# Patient Record
Sex: Female | Born: 1995 | Race: White | Hispanic: No | Marital: Single | Smoking: Never smoker
Health system: Southern US, Community
[De-identification: ages and names within clinical notes are randomized; demographics above are authoritative.]

## PROBLEM LIST (undated history)

## (undated) ENCOUNTER — Inpatient Hospital Stay: Payer: Self-pay

## (undated) DIAGNOSIS — D649 Anemia, unspecified: Secondary | ICD-10-CM

## (undated) DIAGNOSIS — N883 Incompetence of cervix uteri: Secondary | ICD-10-CM

## (undated) DIAGNOSIS — N83209 Unspecified ovarian cyst, unspecified side: Secondary | ICD-10-CM

## (undated) DIAGNOSIS — Z6711 Type A blood, Rh negative: Secondary | ICD-10-CM

## (undated) HISTORY — PX: NO PAST SURGERIES: SHX2092

## (undated) HISTORY — DX: Type A blood, Rh negative: Z67.11

## (undated) HISTORY — DX: Incompetence of cervix uteri: N88.3

---
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
## 2017-01-02 LAB — AFP TETRA
DIA MOM VALUE: 1.75
DIA VALUE (EIA): 317.85 pg/mL
DSR (BY AGE) 1 IN: 1170
DSR (Second Trimester) 1 IN: 1873
Gestational Age: 20 WEEKS
MATERNAL AGE AT EDD: 19.5 a
MSAFP Mom: 0.97
MSAFP: 50.3 ng/mL
MSHCG MOM: 1.98
MSHCG: 43312 m[IU]/mL
OSB RISK: 10000
T18 (By Age): 1:4558 {titer}
TEST RESULTS AFP: NEGATIVE
Weight: 169 [lb_av]
uE3 Mom: 1.97
uE3 Value: 3.42 ng/mL

## 2017-01-03 ENCOUNTER — Ambulatory Visit (INDEPENDENT_AMBULATORY_CARE_PROVIDER_SITE_OTHER): Payer: 59

## 2017-01-03 DIAGNOSIS — Z8751 Personal history of pre-term labor: Secondary | ICD-10-CM | POA: Diagnosis not present

## 2017-01-03 MED ORDER — HYDROXYPROGESTERONE CAPROATE 250 MG/ML IM OIL
250.0000 mg | TOPICAL_OIL | Freq: Once | INTRAMUSCULAR | Status: AC
  Administered 2017-01-03: 250 mg via INTRAMUSCULAR

## 2017-01-03 NOTE — Progress Notes (Signed)
Pt here for Makena inj which was given IM right glut. NDC# [id number].  Pt changed back to IM inj instead of subq d/t site itching.

## 2017-01-06 ENCOUNTER — Other Ambulatory Visit: Payer: Self-pay | Admitting: Obstetrics and Gynecology

## 2017-01-06 MED ORDER — HYDROXYPROGESTERONE CAPROATE 250 MG/ML IM OIL: 250.0000 mg | TOPICAL_OIL | Freq: Once | INTRAMUSCULAR | 4 refills | Status: DC

## 2017-01-07 ENCOUNTER — Telehealth: Payer: Self-pay | Admitting: Obstetrics & Gynecology

## 2017-01-07 ENCOUNTER — Emergency Department
Admission: EM | Admit: 2017-01-07 | Discharge: 2017-01-07 | Disposition: A | Discharge status: Home / Self Care | Payer: 59 | Attending: Emergency Medicine | Admitting: Emergency Medicine

## 2017-01-07 ENCOUNTER — Encounter: Payer: Self-pay | Admitting: Emergency Medicine

## 2017-01-07 DIAGNOSIS — G90A Postural orthostatic tachycardia syndrome (POTS): Secondary | ICD-10-CM

## 2017-01-07 DIAGNOSIS — R000 Tachycardia, unspecified: Secondary | ICD-10-CM

## 2017-01-07 DIAGNOSIS — O26812 Pregnancy related exhaustion and fatigue, second trimester: Secondary | ICD-10-CM | POA: Diagnosis present

## 2017-01-07 DIAGNOSIS — I951 Orthostatic hypotension: Secondary | ICD-10-CM

## 2017-01-07 DIAGNOSIS — Z3A22 22 weeks gestation of pregnancy: Secondary | ICD-10-CM | POA: Insufficient documentation

## 2017-01-07 DIAGNOSIS — I498 Other specified cardiac arrhythmias: Secondary | ICD-10-CM

## 2017-01-07 DIAGNOSIS — R42 Dizziness and giddiness: Secondary | ICD-10-CM

## 2017-01-07 DIAGNOSIS — O99412 Diseases of the circulatory system complicating pregnancy, second trimester: Secondary | ICD-10-CM | POA: Insufficient documentation

## 2017-01-07 LAB — URINALYSIS, COMPLETE (UACMP) WITH MICROSCOPIC
Bacteria, UA: NONE SEEN
Bilirubin Urine: NEGATIVE
GLUCOSE, UA: NEGATIVE mg/dL
Hgb urine dipstick: NEGATIVE
Ketones, ur: NEGATIVE mg/dL
Nitrite: NEGATIVE
Protein, ur: NEGATIVE mg/dL
SPECIFIC GRAVITY, URINE: 1.028 (ref 1.005–1.030)
pH: 5 (ref 5.0–8.0)

## 2017-01-07 LAB — BASIC METABOLIC PANEL
Anion gap: 6 (ref 5–15)
BUN: 11 mg/dL (ref 6–20)
CALCIUM: 8.9 mg/dL (ref 8.9–10.3)
CO2: 23 mmol/L (ref 22–32)
CREATININE: 0.48 mg/dL (ref 0.44–1.00)
Chloride: 107 mmol/L (ref 101–111)
GFR calc non Af Amer: >60 mL/min (ref >60)
Glucose, Bld: 82 mg/dL (ref 65–99)
Potassium: 4 mmol/L (ref 3.5–5.1)
Sodium: 136 mmol/L (ref 135–145)

## 2017-01-07 LAB — CBC
HCT: 31.9 % — ABNORMAL LOW (ref 35.0–47.0)
Hemoglobin: 10.9 g/dL — ABNORMAL LOW (ref 12.0–16.0)
MCH: 28.5 pg (ref 26.0–34.0)
MCHC: 34.3 g/dL (ref 32.0–36.0)
MCV: 83.2 fL (ref 80.0–100.0)
PLATELETS: 188 10*3/uL (ref 150–440)
RBC: 3.84 MIL/uL (ref 3.80–5.20)
RDW: 13.5 % (ref 11.5–14.5)
WBC: 8.3 10*3/uL (ref 3.6–11.0)

## 2017-01-07 MED ORDER — SODIUM CHLORIDE 0.9 % IV SOLN
Freq: Once | INTRAVENOUS | Status: AC
Start: 2017-01-07 — End: 2017-01-07
  Administered 2017-01-07: 15:00:00 via INTRAVENOUS

## 2017-01-07 NOTE — Discharge Instructions (Signed)
Please drink plenty of fluids. Call Linda Choi side schedule an appointment if you do not have one in the next week. Rest and take it easy for the next few days. Please return if you continued to get worse

## 2017-01-07 NOTE — ED Triage Notes (Addendum)
Pt c/o dizziness for last 2 days. Is G3P1A1. Miscarried with first pregnancy.  Describes as lightheaded and room spinning. No pain. Ambulatory to first nurse desk. Pt [redacted] weeks pregnant

## 2017-01-07 NOTE — Telephone Encounter (Signed)
Pt is calling today needing an appointment and report she is dizzy and light headed. Pt states she is [redacted] weeks pregnant. Please advise work in no cancallations at this time

## 2017-01-07 NOTE — ED Notes (Signed)
Pt discharged home after verbalizing understanding of discharge instructions; nad noted. 

## 2017-01-07 NOTE — Telephone Encounter (Signed)
Pt has been aware via vm that she should go to hospital since we have no openings as they can monitor her and make sure all is well with baby. Also needs to stay hydrated as much as she can to avoid dehydration. Have been very busy today and havent had a chance to check tasks frequently. Please make me aware if pt returns call

## 2017-01-07 NOTE — ED Provider Notes (Signed)
[facility] Emergency Department Provider Note   ____________________________________________   First MD Initiated Contact with Patient 01/07/17 1339     (approximate)  I have reviewed the triage vital signs and the nursing notes.   HISTORY  Chief Complaint Dizziness   HPI Linda Choi is a 21 y.o. female who sees [facility]. She is [redacted] weeks pregnant. She reports her last 2-3 days she's been getting increasingly woozy and blacking out while standing up or even sitting down now. She has not actually fallen. She denies any headache shortness of breath or any other complaints. This is her third pregnancy and one full term 1 preterm delivery baby did not live she has not had the symptoms with any previous pregnancy.   Past Medical History:  Diagnosis Date  . Cervical incompetence   . Type A blood, Rh negative     Patient Active Problem List   Diagnosis Date Noted  . History of pregnancy loss in prior pregnancy, currently pregnant in first trimester 10/03/2016  . High risk pregnancy, antepartum 10/03/2016  . Rh negative state in antepartum period 10/03/2016    Past Surgical History:  Procedure Laterality Date  . NO PAST SURGERIES      Prior to Admission medications   Not on File    Allergies Patient has no known allergies.  Family History  Problem Relation Age of Onset  . Breast cancer Neg Hx   . Ovarian cancer Neg Hx     Social History Social History  Substance Use Topics  . Smoking status: Never Smoker  . Smokeless tobacco: Never Used  . Alcohol use No    Review of Systems  Constitutional: No fever/chills Eyes: No visual changes. ENT: No sore throat. Cardiovascular: Denies chest pain. Respiratory: Denies shortness of breath. Gastrointestinal: No abdominal pain.  No nausea, no vomiting.  No diarrhea.  No constipation. Genitourinary: Negative for dysuria. Musculoskeletal: Negative for back pain. Skin: Negative for  rash. Neurological: Negative for headaches, focal weakness or numbness.   ____________________________________________   PHYSICAL EXAM:  VITAL SIGNS: ED Triage Vitals [01/07/17 1236]  Enc Vitals Group     BP 118/60     Pulse Rate 76     Resp 18     Temp 98.5 F (36.9 C)     Temp Source Oral     SpO2 100 %     Weight 169 lb (76.7 kg)     Height 5\' 4"  (1.626 m)     Head Circumference      Peak Flow      Pain Score      Pain Loc      Pain Edu?      Excl. in GC?     Constitutional: Alert and oriented. Well appearing and in no acute distress. Eyes: Conjunctivae are normal.  Head: Atraumatic. Nose: No congestion/rhinnorhea. Mouth/Throat: Mucous membranes are moist.  Oropharynx non-erythematous. Neck: No stridor.   Cardiovascular: Normal rate, regular rhythm. Grossly normal heart sounds.  Good peripheral circulation. Respiratory: Normal respiratory effort.  No retractions. Lungs CTAB. Gastrointestinal: Soft and nontender. No distention. No abdominal bruits. No CVA tenderness. Musculoskeletal: No lower extremity tenderness nor edema.  No joint effusions. Neurologic:  Normal speech and language. No gross focal neurologic deficits are appreciated. Skin:  Skin is warm, dry and intact. No rash noted. Psychiatric: Mood and affect are normal. Speech and behavior are normal.  ____________________________________________   LABS (all labs ordered are listed, but only abnormal results  are displayed)  Labs Reviewed  CBC - Abnormal; Notable for the following:       Result Value   Hemoglobin 10.9 (*)    HCT 31.9 (*)    All other components within normal limits  URINALYSIS, COMPLETE (UACMP) WITH MICROSCOPIC - Abnormal; Notable for the following:    Color, Urine YELLOW (*)    APPearance HAZY (*)    Leukocytes, UA SMALL (*)    Squamous Epithelial / LPF 6-30 (*)    All other components within normal limits  URINE CULTURE  BASIC METABOLIC PANEL    ____________________________________________  EKG  EKG read and interpreted by me shows normal sinus rhythm rate of 92 normal axis no acute ST-T wave changes there is a short PR interval ____________________________________________  RADIOLOGY   ____________________________________________   PROCEDURES  Procedure(s) performed:   Procedures  Critical Care performed:   ____________________________________________   INITIAL IMPRESSION / ASSESSMENT AND PLAN / ED COURSE  Pertinent labs & imaging results that were available during my care of the patient were reviewed by me and considered in my medical decision making (see chart for details).  Discussed patient with Dr. Chauncey [facility]el [facility] who request that we have the patient follow-up with [facility] side at her next appointment. Patient labs are essentially normal she is not orthostatic  Clinical Course as of Jan 07 1513  Tue Jan 07, 2017  1339 BUN: 11 [PM]    Clinical Course User Index [PM] Arnaldo Natal, MD     ____________________________________________   FINAL CLINICAL IMPRESSION(S) / ED DIAGNOSES  Final diagnoses:  POTS (postural orthostatic tachycardia syndrome)  Dizziness      NEW MEDICATIONS STARTED DURING THIS VISIT:  New Prescriptions   No medications on file     Note:  This document was prepared using Dragon voice recognition software and may include unintentional dictation errors.    Arnaldo Natal, MD 01/07/17 1515

## 2017-01-09 ENCOUNTER — Ambulatory Visit (INDEPENDENT_AMBULATORY_CARE_PROVIDER_SITE_OTHER): Payer: 59 | Admitting: Obstetrics and Gynecology

## 2017-01-09 VITALS — BP 100/60 | Wt 174.0 lb

## 2017-01-09 DIAGNOSIS — O0990 Supervision of high risk pregnancy, unspecified, unspecified trimester: Secondary | ICD-10-CM | POA: Diagnosis not present

## 2017-01-09 DIAGNOSIS — R42 Dizziness and giddiness: Secondary | ICD-10-CM | POA: Diagnosis not present

## 2017-01-09 DIAGNOSIS — Z6791 Unspecified blood type, Rh negative: Secondary | ICD-10-CM

## 2017-01-09 DIAGNOSIS — O09899 Supervision of other high risk pregnancies, unspecified trimester: Secondary | ICD-10-CM

## 2017-01-09 DIAGNOSIS — O26899 Other specified pregnancy related conditions, unspecified trimester: Secondary | ICD-10-CM

## 2017-01-09 DIAGNOSIS — O09291 Supervision of pregnancy with other poor reproductive or obstetric history, first trimester: Secondary | ICD-10-CM

## 2017-01-09 LAB — URINE CULTURE: Culture: NO GROWTH

## 2017-01-09 MED ORDER — HYDROXYPROGESTERONE CAPROATE 250 MG/ML IM OIL
250.0000 mg | TOPICAL_OIL | Freq: Once | INTRAMUSCULAR | Status: AC
  Administered 2017-01-09: 250 mg via INTRAMUSCULAR

## 2017-01-09 MED ORDER — HYDROXYPROGESTERONE CAPROATE 275 MG/1.1ML ~~LOC~~ SOAJ: 275.0000 mg | Freq: Once | SUBCUTANEOUS | Status: DC

## 2017-01-09 NOTE — Progress Notes (Signed)
Obstetrics & Gynecology Office Visit   Chief Complaint  Patient presents with  . Follow-up    dizziness   Pregnancy  History of Present Illness: 21 y.o. G85P1101 [redacted]w[redacted]d female who is seen in ER follow up for feeling dizzy and lightheaded.  She reports a 5 day history of feeling light-headed and dizzy. She states she has never passed out, but did state at the ER that she has blacked out.  She has not had these symptoms in any previous pregnancy.  She states that it does not matter whether she is standing, sitting, or lying down.  Her symptoms can come on at any time and are not resolved by anything, including change in position (lying on her side).  She was evaluated in the ER and had normal laboratory work, orthostatic vital signs, and an EKG. She denies ringing in her ears, upper respiratory and sinus symptoms, headache, exposure to new medication, use of drugs. She notes good fetal movements, denies abdominal cramping, denies vaginal bleeding, denies leakage of fluid.  She is taking weekly 17 hydroxyprogesterone injections.  Past Medical History:  Diagnosis Date  . Cervical incompetence   . Type A blood, Rh negative     Past Surgical History:  Procedure Laterality Date  . NO PAST SURGERIES      Gynecologic History: Patient's last menstrual period was 08/08/2016 (approximate).  Obstetric History: [id number]  Family History  Problem Relation Age of Onset  . Breast cancer Neg Hx   . Ovarian cancer Neg Hx     Social History   Social History  . Marital status: Single    Spouse name: N/A  . Number of children: 2  . Years of education: N/A   Occupational History  . Not on file.   Social History Main Topics  . Smoking status: Never Smoker  . Smokeless tobacco: Never Used  . Alcohol use No  . Drug use: No  . Sexual activity: Yes    Birth control/ protection: None   Other Topics Concern  . Not on file   Social History Narrative  . No narrative on file    No Known  Allergies  Prior to Admission medications   Not on File    Review of Systems  Constitutional: Negative.   HENT: Negative.  Negative for congestion, ear discharge, ear pain, hearing loss, sinus pain, sore throat and tinnitus.   Eyes: Negative.  Negative for blurred vision, double vision, photophobia, pain and discharge.  Respiratory: Negative.  Negative for cough, hemoptysis, sputum production, shortness of breath and wheezing.   Cardiovascular: Negative.  Negative for chest pain, palpitations, orthopnea and leg swelling.  Gastrointestinal: Negative.  Negative for abdominal pain, blood in stool, constipation, diarrhea, heartburn, melena, nausea and vomiting.  Genitourinary: Negative.  Negative for dysuria, flank pain, frequency, hematuria and urgency.  Musculoskeletal: Negative.  Negative for falls.  Skin: Negative.   Neurological: Positive for dizziness. Negative for tingling, tremors, sensory change, speech change, focal weakness, seizures, loss of consciousness and headaches.  Psychiatric/Behavioral: Negative.      Physical Exam BP 100/60   Wt 174 lb (78.9 kg)   LMP 08/08/2016 (Approximate)   BMI 29.87 kg/m  Patient's last menstrual period was 08/08/2016 (approximate). Physical Exam  Constitutional: She is oriented to person, place, and time. She appears well-developed and well-nourished. No distress.  HENT:  Head: Normocephalic and atraumatic.  Right Ear: External ear normal.  Left Ear: External ear normal.  Mouth/Throat: Oropharynx is clear and moist. No oropharyngeal  exudate.  Eyes: EOM are normal. Right eye exhibits no discharge. Left eye exhibits no discharge. No scleral icterus.  Neck: Normal range of motion. Neck supple. No thyromegaly present.  Cardiovascular: Normal rate, regular rhythm, normal heart sounds and intact distal pulses.  Exam reveals no gallop and no friction rub.   No murmur heard. Pulmonary/Chest: Effort normal and breath sounds normal. No respiratory  distress. She has no wheezes. She has no rales.  Abdominal: Soft. Bowel sounds are normal. She exhibits no distension. There is no tenderness. There is no guarding.  Gravid at U+2  Musculoskeletal: Normal range of motion. She exhibits no edema.  Lymphadenopathy:    She has no cervical adenopathy.  Neurological: She is alert and oriented to person, place, and time. She displays normal reflexes. No cranial nerve deficit or sensory deficit. She exhibits normal muscle tone. Coordination normal.  Skin: Skin is warm and dry. No erythema.  Psychiatric: She has a normal mood and affect. Her behavior is normal. Judgment normal.   Female chaperone present for pelvic and breast  portions of the physical exam  Assessment: 21 y.o. G3P1101 female at 2070w0d with dizzyness, light-headedness.  She had a negative basic workup in the ER. Will refer to Neurology for further investigation.    Plan: Problem List Items Addressed This Visit    History of pregnancy loss in prior pregnancy, currently pregnant in first trimester   Relevant Medications   HYDROXYprogesterone Caproate SOAJ 275 mg   hydroxyprogesterone caproate (MAKENA) 250 mg/mL injection 250 mg (Completed)   High risk pregnancy, antepartum - Primary   Relevant Orders   Ambulatory referral to Neurology   Rh negative state in antepartum period    Other Visit Diagnoses    Dizzy spells       Relevant Orders   Ambulatory referral to Neurology     Keep routine OB appointments and continue 17-OHP injections weekly.  20 minutes spent in face to face discussion with > 50% spent in counseling and management of her dizzyness and lightheadedness.   Thomasene MohairStephen Jhene Westmoreland, MD 01/09/2017 4:55 PM

## 2017-01-10 ENCOUNTER — Ambulatory Visit: Payer: 59

## 2017-01-13 ENCOUNTER — Encounter: Payer: Self-pay | Admitting: Neurology

## 2017-01-16 ENCOUNTER — Ambulatory Visit: Payer: 59

## 2017-01-16 ENCOUNTER — Ambulatory Visit (INDEPENDENT_AMBULATORY_CARE_PROVIDER_SITE_OTHER): Payer: 59

## 2017-01-16 DIAGNOSIS — O09212 Supervision of pregnancy with history of pre-term labor, second trimester: Secondary | ICD-10-CM

## 2017-01-16 DIAGNOSIS — Z3A23 23 weeks gestation of pregnancy: Secondary | ICD-10-CM

## 2017-01-16 MED ORDER — HYDROXYPROGESTERONE CAPROATE 250 MG/ML IM OIL
250.0000 mg | TOPICAL_OIL | Freq: Once | INTRAMUSCULAR | Status: AC
  Administered 2017-01-16: 250 mg via INTRAMUSCULAR

## 2017-01-22 ENCOUNTER — Ambulatory Visit (INDEPENDENT_AMBULATORY_CARE_PROVIDER_SITE_OTHER): Payer: 59 | Admitting: Neurology

## 2017-01-22 ENCOUNTER — Encounter: Payer: Self-pay | Admitting: Neurology

## 2017-01-22 VITALS — BP 100/68 | HR 92 | Ht 64.0 in | Wt 178.1 lb

## 2017-01-22 DIAGNOSIS — R55 Syncope and collapse: Secondary | ICD-10-CM | POA: Diagnosis not present

## 2017-01-22 NOTE — Patient Instructions (Signed)
It sounds like dizziness, related to pregnancy (low blood pressure, mild anemia).  I don't think it is related to your brain.  I would ask your OB to check iron, since it helped last time.

## 2017-01-22 NOTE — Progress Notes (Signed)
NEUROLOGY CONSULTATION NOTE  Linda Laughterichole Deann Tinnell MRN: [id number] DOB: Feb 23, 1996  Referring provider: Dr. Jean RosenthalJackson Primary care provider: No PCP  Reason for consult:  dizziness  HISTORY OF PRESENT ILLNESS: Linda Choi is a 21 year old female who is [redacted] weeks gestation who presents for dizziness.  History supplemented by ED and OG/GYN notes.  For the past 2  weeks, she has been experiencing episodic dizziness.  She describes it not as a spinning sensation, but rather lightheadedness a if she is going to pass out.  It usually lasts a couple of seconds.  It is triggered by change in position but also may occur spontaneously while sitting, standing or laying down.  It often will occur throughout the day everyday, however she hasn't had an episode for the past 2 days.  There is some associated shortness of breath and chest pressure.  On two occasions, she had black out of vision for a couple of seconds.  She never actually passed out.  She denies headache, double vision, diaphoresis, palpitations, slurred speech or numbness or weakness.  She was evaluated in the ED on 01/07/17.  CBC showed mild anemia with Hgb 10.9, HCT 31.9 and MCV 83.2.  BMP was normal, including a Na 136, K 4.0, glucose of 82, BUN 11 and Cr 0.48.  UA was hazy with small leukocytes but no  nitrite, WBC 6-30 and no bacteria.  EKG showed normal sinus rhythm of 92 bpm.  Orthostatic vitals were negative.    She has two prior pregnancies, including one miscarriage.  She has a one year old child and had experienced this same dizziness during that pregnancy, starting around the same time (about 22 weeks).  She was found to have iron deficiency.  Once she started supplementation, the dizziness resolved.  She has history of low blood pressure with some fluctuation.  Blood pressure on 01/07/17 was 118/60 with pulse of 76 bpm.  Blood pressure on 10/25/16 was 132/72.  Blood pressure on 10/03/16 was 102/58 with pulse of 90.  It was 107/43 with  pulse of 131 on 05/12/16.  It was 122/64 on 02/19/16 with pulse of 105.  It was 117/69 on 03/31/15 with pulse of 107.    PAST MEDICAL HISTORY: Past Medical History:  Diagnosis Date  . Cervical incompetence   . Type A blood, Rh negative     PAST SURGICAL HISTORY: Past Surgical History:  Procedure Laterality Date  . NO PAST SURGERIES      MEDICATIONS: No current outpatient prescriptions on file prior to visit.   Current Facility-Administered Medications on File Prior to Visit  Medication Dose Route Frequency Provider Last Rate Last Dose  . hydroxyprogesterone caproate (MAKENA) 250 mg/mL injection 250 mg  250 mg Intramuscular Once Nadara MustardHarris, Robert P, MD      . HYDROXYprogesterone Caproate SOAJ 275 mg  275 mg Subcutaneous Once Tresea MallGledhill, Jane, CNM      . HYDROXYprogesterone Caproate SOAJ 275 mg  275 mg Subcutaneous Once Conard NovakJackson, Stephen D, MD        ALLERGIES: No Known Allergies  FAMILY HISTORY: Family History  Problem Relation Age of Onset  . Eclampsia Mother   . Breast cancer Neg Hx   . Ovarian cancer Neg Hx     SOCIAL HISTORY: Social History   Social History  . Marital status: Single    Spouse name: N/A  . Number of children: 2  . Years of education: GED   Occupational History  .  [employer]  Social History Main Topics  . Smoking status: Never Smoker  . Smokeless tobacco: Never Used  . Alcohol use No  . Drug use: No  . Sexual activity: Yes    Partners: Male    Birth control/ protection: None   Other Topics Concern  . Not on file   Social History Narrative   Lives with boyfriend and dad in a 2 story home.  Has 1 son and is pregnant with a daughter.  Due in November 2018.  Works at [employer].  Education: GED.     REVIEW OF SYSTEMS: Constitutional: No fevers, chills, or sweats, no generalized fatigue, change in appetite Eyes: No visual changes, double vision, eye pain Ear, nose and throat: No hearing loss, ear pain, nasal congestion, sore  throat Cardiovascular: No chest pain, palpitations Respiratory:  No shortness of breath at rest or with exertion, wheezes GastrointestinaI: No nausea, vomiting, diarrhea, abdominal pain, fecal incontinence Genitourinary:  No dysuria, urinary retention or frequency Musculoskeletal:  No neck pain, back pain Integumentary: No rash, pruritus, skin lesions Neurological: as above Psychiatric: No depression, insomnia, anxiety Endocrine: No palpitations, fatigue, diaphoresis, mood swings, change in appetite, change in weight, increased thirst Hematologic/Lymphatic:  No purpura, petechiae. Allergic/Immunologic: no itchy/runny eyes, nasal congestion, recent allergic reactions, rashes  PHYSICAL EXAM: Vitals:   01/22/17 0958  BP: 100/68  Pulse: 92   General: No acute distress.  Patient appears well-groomed.  Head:  Normocephalic/atraumatic Eyes:  fundi examined but not visualized Neck: supple, no paraspinal tenderness, full range of motion Back: No paraspinal tenderness Heart: regular rate and rhythm Lungs: Clear to auscultation bilaterally. Vascular: No carotid bruits. Neurological Exam: Mental status: alert and oriented to person, place, and time, recent and remote memory intact, fund of knowledge intact, attention and concentration intact, speech fluent and not dysarthric, language intact. Cranial nerves: CN I: not tested CN II: pupils equal, round and reactive to light, visual fields intact CN III, IV, VI:  full range of motion, no nystagmus, no ptosis CN V: facial sensation intact CN VII: upper and lower face symmetric CN VIII: hearing intact CN IX, X: gag intact, uvula midline CN XI: sternocleidomastoid and trapezius muscles intact CN XII: tongue midline Bulk & Tone: normal, no fasciculations. Motor:  5/5 throughout  Sensation: temperature and vibration sensation intact. Deep Tendon Reflexes:  2+ throughout, toes downgoing.  Finger to nose testing:  Without dysmetria.  Heel to  shin:  Without dysmetria.  Gait:  Normal station and stride.  Able to turn and tandem walk. Romberg negative.  IMPRESSION & RECOMMENDATION: Near-syncope/dizziness. I think it is a symptoms of her pregnancy.  She does not describe vertigo.  She describes near-syncope.  Although orthostatic vitals were negative, she has baseline low blood pressure which likely makes her more susceptible to dizziness in pregnancy.  She is mildly anemic.  She had this same dizziness with her last pregnancy and was found to have low iron, so it may be helpful to check her iron.  I would also consider further cardiac workup if clinically indicated.  I do not think her symptoms are neurologic or secondary to an intracranial abnormality (such as mass lesion, thrombosis, seizure or migraine).  Thank you for allowing me to take part in the care of this patient.  Shon Millet, DO  CC:  Thomasene Mohair, MD

## 2017-01-23 ENCOUNTER — Ambulatory Visit (INDEPENDENT_AMBULATORY_CARE_PROVIDER_SITE_OTHER): Payer: 59

## 2017-01-23 ENCOUNTER — Encounter: Payer: Self-pay | Admitting: Obstetrics and Gynecology

## 2017-01-23 ENCOUNTER — Ambulatory Visit (INDEPENDENT_AMBULATORY_CARE_PROVIDER_SITE_OTHER): Payer: 59 | Admitting: Obstetrics and Gynecology

## 2017-01-23 VITALS — BP 118/62 | Wt 180.0 lb

## 2017-01-23 DIAGNOSIS — Z3A24 24 weeks gestation of pregnancy: Secondary | ICD-10-CM

## 2017-01-23 DIAGNOSIS — O26899 Other specified pregnancy related conditions, unspecified trimester: Secondary | ICD-10-CM

## 2017-01-23 DIAGNOSIS — Z048 Encounter for examination and observation for other specified reasons: Secondary | ICD-10-CM | POA: Diagnosis not present

## 2017-01-23 DIAGNOSIS — O09291 Supervision of pregnancy with other poor reproductive or obstetric history, first trimester: Secondary | ICD-10-CM

## 2017-01-23 DIAGNOSIS — IMO0002 Reserved for concepts with insufficient information to code with codable children: Secondary | ICD-10-CM

## 2017-01-23 DIAGNOSIS — Z0489 Encounter for examination and observation for other specified reasons: Secondary | ICD-10-CM

## 2017-01-23 DIAGNOSIS — Z6791 Unspecified blood type, Rh negative: Secondary | ICD-10-CM | POA: Diagnosis not present

## 2017-01-23 DIAGNOSIS — O0990 Supervision of high risk pregnancy, unspecified, unspecified trimester: Secondary | ICD-10-CM

## 2017-01-23 DIAGNOSIS — O09299 Supervision of pregnancy with other poor reproductive or obstetric history, unspecified trimester: Secondary | ICD-10-CM | POA: Insufficient documentation

## 2017-01-23 DIAGNOSIS — O09899 Supervision of other high risk pregnancies, unspecified trimester: Secondary | ICD-10-CM

## 2017-01-23 MED ORDER — HYDROXYPROGESTERONE CAPROATE 275 MG/1.1ML ~~LOC~~ SOAJ: 275.0000 mg | Freq: Once | SUBCUTANEOUS | Status: DC

## 2017-01-23 MED ORDER — HYDROXYPROGESTERONE CAPROATE 250 MG/ML IM OIL
250.0000 mg | TOPICAL_OIL | Freq: Once | INTRAMUSCULAR | Status: DC
  Administered 2017-01-23: 250 mg via INTRAMUSCULAR

## 2017-01-23 NOTE — Progress Notes (Signed)
Linda Choi (17P) today Anatomy scan

## 2017-01-23 NOTE — Addendum Note (Signed)
Addended by: SwazilandJORDAN, Mieke Brinley B on: 01/23/2017 10:15 AM   Modules accepted: Orders

## 2017-01-30 ENCOUNTER — Ambulatory Visit (INDEPENDENT_AMBULATORY_CARE_PROVIDER_SITE_OTHER): Payer: 59

## 2017-01-30 DIAGNOSIS — O09212 Supervision of pregnancy with history of pre-term labor, second trimester: Secondary | ICD-10-CM | POA: Diagnosis not present

## 2017-01-30 DIAGNOSIS — O09892 Supervision of other high risk pregnancies, second trimester: Secondary | ICD-10-CM

## 2017-01-30 MED ORDER — HYDROXYPROGESTERONE CAPROATE 250 MG/ML IM OIL
250.0000 mg | TOPICAL_OIL | Freq: Once | INTRAMUSCULAR | Status: AC
  Administered 2017-01-30: 250 mg via INTRAMUSCULAR

## 2017-01-30 NOTE — Progress Notes (Signed)
Pt here for makena inj which was given IM right glut.  NDC # [id number]

## 2017-02-06 ENCOUNTER — Ambulatory Visit (INDEPENDENT_AMBULATORY_CARE_PROVIDER_SITE_OTHER): Payer: Medicaid Other

## 2017-02-06 DIAGNOSIS — Z8751 Personal history of pre-term labor: Secondary | ICD-10-CM

## 2017-02-06 MED ORDER — HYDROXYPROGESTERONE CAPROATE 250 MG/ML IM OIL
250.0000 mg | TOPICAL_OIL | Freq: Once | INTRAMUSCULAR | Status: AC
  Administered 2017-02-06: 250 mg via INTRAMUSCULAR

## 2017-02-06 MED ORDER — HYDROXYPROGESTERONE CAPROATE 275 MG/1.1ML ~~LOC~~ SOAJ: 275.0000 mg | Freq: Once | SUBCUTANEOUS | Status: DC

## 2017-02-13 ENCOUNTER — Observation Stay
Admission: EM | Admit: 2017-02-13 | Discharge: 2017-02-13 | Disposition: A | Discharge status: Home / Self Care | Payer: 59 | Attending: Obstetrics and Gynecology | Admitting: Obstetrics and Gynecology

## 2017-02-13 ENCOUNTER — Ambulatory Visit (INDEPENDENT_AMBULATORY_CARE_PROVIDER_SITE_OTHER): Payer: Medicaid Other

## 2017-02-13 DIAGNOSIS — O471 False labor at or after 37 completed weeks of gestation: Secondary | ICD-10-CM | POA: Diagnosis not present

## 2017-02-13 DIAGNOSIS — O09212 Supervision of pregnancy with history of pre-term labor, second trimester: Principal | ICD-10-CM

## 2017-02-13 DIAGNOSIS — Z3A27 27 weeks gestation of pregnancy: Secondary | ICD-10-CM

## 2017-02-13 DIAGNOSIS — O09219 Supervision of pregnancy with history of pre-term labor, unspecified trimester: Secondary | ICD-10-CM

## 2017-02-13 DIAGNOSIS — Z3490 Encounter for supervision of normal pregnancy, unspecified, unspecified trimester: Secondary | ICD-10-CM

## 2017-02-13 DIAGNOSIS — O09892 Supervision of other high risk pregnancies, second trimester: Secondary | ICD-10-CM

## 2017-02-13 MED ORDER — ACETAMINOPHEN 325 MG PO TABS: 650.0000 mg | ORAL_TABLET | ORAL | Status: DC | PRN

## 2017-02-13 NOTE — Final Progress Note (Signed)
Physician Final Progress Note  Patient ID: Linda Choi MRN: [id number] DOB/AGE: 19/12/1995 19 y.o.  Admit date: 02/13/2017 Admitting provider: Vena AustriaAndreas Jupiter Boys, MD Discharge date: 02/13/2017   Admission Diagnoses: Leaking fluid  Discharge Diagnoses:  Active Problems:   Pregnancy   Labor and delivery indication for care or intervention  21 year old G3P1101 [redacted]w[redacted]d presenting with concern for leaking fluid.  Denies large gush but just has felt increased moisture.  Incidentally the patient has had a URI for the past few days with cough.  No fevers, no chills, +FM, no VB, no ctx.  Negative nitrazine, ferning, and pooling.  Discussed normal increase in discharge in pregnancy, as well as more likely etiology of loss of urine with cough.  Has follow next week for 28 week labs.  Consults: none  Significant Findings/ Diagnostic Studies: Negative ferning  Procedures: NST Baseline: 150 Variability: moderate Accelerations: present Decelerations: absent Tocometry: none The patient was monitored for 30 minutes, fetal heart rate tracing was deemed reactive, category I tracing,   Discharge Condition: good  Disposition: 01-Home or Self Care  Diet: Regular diet  Discharge Activity: Activity as tolerated  Discharge Instructions    Discharge activity:  No Restrictions    Complete by:  As directed    Discharge diet:  No restrictions    Complete by:  As directed    No sexual activity restrictions    Complete by:  As directed    Notify physician for a general feeling that "something is not right"    Complete by:  As directed    Notify physician for increase or change in vaginal discharge    Complete by:  As directed    Notify physician for intestinal cramps, with or without diarrhea, sometimes described as "gas pain"    Complete by:  As directed    Notify physician for leaking of fluid    Complete by:  As directed    Notify physician for low, dull backache, unrelieved by heat or  Tylenol    Complete by:  As directed    Notify physician for menstrual like cramps    Complete by:  As directed    Notify physician for pelvic pressure    Complete by:  As directed    Notify physician for uterine contractions.  These may be painless and feel like the uterus is tightening or the baby is  "balling up"    Complete by:  As directed    Notify physician for vaginal bleeding    Complete by:  As directed    PRETERM LABOR:  Includes any of the follwing symptoms that occur between 20 - [redacted] weeks gestation.  If these symptoms are not stopped, preterm labor can result in preterm delivery, placing your baby at risk    Complete by:  As directed      Allergies as of 02/13/2017   No Known Allergies     Medication List    You have not been prescribed any medications.      Total time spent taking care of this patient: 30 minutes  Signed: Vena Austriandreas Donika Butner 02/13/2017, 10:16 PM

## 2017-02-13 NOTE — OB Triage Note (Signed)
Pt to LDR 6 with c/o LOF.  Pt to bathroom to change into hospital gown at this time. Nitrazine negative no fluid noted to perineum at this time

## 2017-02-13 NOTE — Discharge Summary (Signed)
See final progress note. 

## 2017-02-20 ENCOUNTER — Ambulatory Visit (INDEPENDENT_AMBULATORY_CARE_PROVIDER_SITE_OTHER): Payer: Medicaid Other | Admitting: Advanced Practice Midwife

## 2017-02-20 ENCOUNTER — Other Ambulatory Visit: Payer: Self-pay | Admitting: Advanced Practice Midwife

## 2017-02-20 ENCOUNTER — Other Ambulatory Visit: Payer: Medicaid Other

## 2017-02-20 VITALS — BP 114/62 | Wt 188.0 lb

## 2017-02-20 DIAGNOSIS — Z6791 Unspecified blood type, Rh negative: Secondary | ICD-10-CM | POA: Diagnosis not present

## 2017-02-20 DIAGNOSIS — O09213 Supervision of pregnancy with history of pre-term labor, third trimester: Secondary | ICD-10-CM

## 2017-02-20 DIAGNOSIS — O09893 Supervision of other high risk pregnancies, third trimester: Secondary | ICD-10-CM

## 2017-02-20 DIAGNOSIS — Z3A28 28 weeks gestation of pregnancy: Secondary | ICD-10-CM | POA: Diagnosis not present

## 2017-02-20 DIAGNOSIS — O09629 Supervision of young multigravida, unspecified trimester: Secondary | ICD-10-CM

## 2017-02-20 DIAGNOSIS — Z369 Encounter for antenatal screening, unspecified: Secondary | ICD-10-CM

## 2017-02-20 DIAGNOSIS — O26899 Other specified pregnancy related conditions, unspecified trimester: Principal | ICD-10-CM

## 2017-02-20 DIAGNOSIS — O09899 Supervision of other high risk pregnancies, unspecified trimester: Secondary | ICD-10-CM | POA: Diagnosis not present

## 2017-02-20 DIAGNOSIS — O0990 Supervision of high risk pregnancy, unspecified, unspecified trimester: Secondary | ICD-10-CM

## 2017-02-20 DIAGNOSIS — Z3A24 24 weeks gestation of pregnancy: Secondary | ICD-10-CM

## 2017-02-20 DIAGNOSIS — Z139 Encounter for screening, unspecified: Secondary | ICD-10-CM

## 2017-02-20 MED ORDER — RHO D IMMUNE GLOBULIN 1500 UNIT/2ML IJ SOSY
300.0000 ug | PREFILLED_SYRINGE | Freq: Once | INTRAMUSCULAR | Status: AC
  Administered 2017-02-20: 300 ug via INTRAMUSCULAR

## 2017-02-20 MED ORDER — RHO D IMMUNE GLOBULIN 1500 UNITS IM SOSY: 1500.0000 [IU] | PREFILLED_SYRINGE | Freq: Once | INTRAMUSCULAR | Status: DC

## 2017-02-20 MED ORDER — HYDROXYPROGESTERONE CAPROATE 250 MG/ML IM OIL
250.0000 mg | TOPICAL_OIL | Freq: Once | INTRAMUSCULAR | Status: AC
  Administered 2017-02-20: 250 mg via INTRAMUSCULAR

## 2017-02-20 NOTE — Patient Instructions (Signed)
Third Trimester of Pregnancy The third trimester is from week 28 through week 40 (months 7 through 9). The third trimester is a time when the unborn baby (fetus) is growing rapidly. At the end of the ninth month, the fetus is about 20 inches in length and weighs 6-10 pounds. Body changes during your third trimester Your body will continue to go through many changes during pregnancy. The changes vary from woman to woman. During the third trimester:  Your weight will continue to increase. You can expect to gain 25-35 pounds (11-16 kg) by the end of the pregnancy.  You may begin to get stretch marks on your hips, abdomen, and breasts.  You may urinate more often because the fetus is moving lower into your pelvis and pressing on your bladder.  You may develop or continue to have heartburn. This is caused by increased hormones that slow down muscles in the digestive tract.  You may develop or continue to have constipation because increased hormones slow digestion and cause the muscles that push waste through your intestines to relax.  You may develop hemorrhoids. These are swollen veins (varicose veins) in the rectum that can itch or be painful.  You may develop swollen, bulging veins (varicose veins) in your legs.  You may have increased body aches in the pelvis, back, or thighs. This is due to weight gain and increased hormones that are relaxing your joints.  You may have changes in your hair. These can include thickening of your hair, rapid growth, and changes in texture. Some women also have hair loss during or after pregnancy, or hair that feels dry or thin. Your hair will most likely return to normal after your baby is born.  Your breasts will continue to grow and they will continue to become tender. A yellow fluid (colostrum) may leak from your breasts. This is the first milk you are producing for your baby.  Your belly button may stick out.  You may notice more swelling in your hands,  face, or ankles.  You may have increased tingling or numbness in your hands, arms, and legs. The skin on your belly may also feel numb.  You may feel short of breath because of your expanding uterus.  You may have more problems sleeping. This can be caused by the size of your belly, increased need to urinate, and an increase in your body's metabolism.  You may notice the fetus "dropping," or moving lower in your abdomen (lightening).  You may have increased vaginal discharge.  You may notice your joints feel loose and you may have pain around your pelvic bone.  What to expect at prenatal visits You will have prenatal exams every 2 weeks until week 36. Then you will have weekly prenatal exams. During a routine prenatal visit:  You will be weighed to make sure you and the baby are growing normally.  Your blood pressure will be taken.  Your abdomen will be measured to track your baby's growth.  The fetal heartbeat will be listened to.  Any test results from the previous visit will be discussed.  You may have a cervical check near your due date to see if your cervix has softened or thinned (effaced).  You will be tested for Group B streptococcus. This happens between 35 and 37 weeks.  Your health care provider may ask you:  What your birth plan is.  How you are feeling.  If you are feeling the baby move.  If you have had   any abnormal symptoms, such as leaking fluid, bleeding, severe headaches, or abdominal cramping.  If you are using any tobacco products, including cigarettes, chewing tobacco, and electronic cigarettes.  If you have any questions.  Other tests or screenings that may be performed during your third trimester include:  Blood tests that check for low iron levels (anemia).  Fetal testing to check the health, activity level, and growth of the fetus. Testing is done if you have certain medical conditions or if there are problems during the  pregnancy.  Nonstress test (NST). This test checks the health of your baby to make sure there are no signs of problems, such as the baby not getting enough oxygen. During this test, a belt is placed around your belly. The baby is made to move, and its heart rate is monitored during movement.  What is false labor? False labor is a condition in which you feel small, irregular tightenings of the muscles in the womb (contractions) that usually go away with rest, changing position, or drinking water. These are called Braxton Hicks contractions. Contractions may last for hours, days, or even weeks before true labor sets in. If contractions come at regular intervals, become more frequent, increase in intensity, or become painful, you should see your health care provider. What are the signs of labor?  Abdominal cramps.  Regular contractions that start at 10 minutes apart and become stronger and more frequent with time.  Contractions that start on the top of the uterus and spread down to the lower abdomen and back.  Increased pelvic pressure and dull back pain.  A watery or bloody mucus discharge that comes from the vagina.  Leaking of amniotic fluid. This is also known as your "water breaking." It could be a slow trickle or a gush. Let your health care provider know if it has a color or strange odor. If you have any of these signs, call your health care provider right away, even if it is before your due date. Follow these instructions at home: Medicines  Follow your health care provider's instructions regarding medicine use. Specific medicines may be either safe or unsafe to take during pregnancy.  Take a prenatal vitamin that contains at least 600 micrograms (mcg) of folic acid.  If you develop constipation, try taking a stool softener if your health care provider approves. Eating and drinking  Eat a balanced diet that includes fresh fruits and vegetables, whole grains, good sources of protein  such as meat, eggs, or tofu, and low-fat dairy. Your health care provider will help you determine the amount of weight gain that is right for you.  Avoid raw meat and uncooked cheese. These carry germs that can cause birth defects in the baby.  If you have low calcium intake from food, talk to your health care provider about whether you should take a daily calcium supplement.  Eat four or five small meals rather than three large meals a day.  Limit foods that are high in fat and processed sugars, such as fried and sweet foods.  To prevent constipation: ? Drink enough fluid to keep your urine clear or pale yellow. ? Eat foods that are high in fiber, such as fresh fruits and vegetables, whole grains, and beans. Activity  Exercise only as directed by your health care provider. Most women can continue their usual exercise routine during pregnancy. Try to exercise for 30 minutes at least 5 days a week. Stop exercising if you experience uterine contractions.  Avoid heavy   lifting.  Do not exercise in extreme heat or humidity, or at high altitudes.  Wear low-heel, comfortable shoes.  Practice good posture.  You may continue to have sex unless your health care provider tells you otherwise. Relieving pain and discomfort  Take frequent breaks and rest with your legs elevated if you have leg cramps or low back pain.  Take warm sitz baths to soothe any pain or discomfort caused by hemorrhoids. Use hemorrhoid cream if your health care provider approves.  Wear a good support bra to prevent discomfort from breast tenderness.  If you develop varicose veins: ? Wear support pantyhose or compression stockings as told by your healthcare provider. ? Elevate your feet for 15 minutes, 3-4 times a day. Prenatal care  Write down your questions. Take them to your prenatal visits.  Keep all your prenatal visits as told by your health care provider. This is important. Safety  Wear your seat belt at  all times when driving.  Make a list of emergency phone numbers, including numbers for family, friends, the hospital, and police and fire departments. General instructions  Avoid cat litter boxes and soil used by cats. These carry germs that can cause birth defects in the baby. If you have a cat, ask someone to clean the litter box for you.  Do not travel far distances unless it is absolutely necessary and only with the approval of your health care provider.  Do not use hot tubs, steam rooms, or saunas.  Do not drink alcohol.  Do not use any products that contain nicotine or tobacco, such as cigarettes and e-cigarettes. If you need help quitting, ask your health care provider.  Do not use any medicinal herbs or unprescribed drugs. These chemicals affect the formation and growth of the baby.  Do not douche or use tampons or scented sanitary pads.  Do not cross your legs for long periods of time.  To prepare for the arrival of your baby: ? Take prenatal classes to understand, practice, and ask questions about labor and delivery. ? Make a trial run to the hospital. ? Visit the hospital and tour the maternity area. ? Arrange for maternity or paternity leave through employers. ? Arrange for family and friends to take care of pets while you are in the hospital. ? Purchase a rear-facing car seat and make sure you know how to install it in your car. ? Pack your hospital bag. ? Prepare the baby's nursery. Make sure to remove all pillows and stuffed animals from the baby's crib to prevent suffocation.  Visit your dentist if you have not gone during your pregnancy. Use a soft toothbrush to brush your teeth and be gentle when you floss. Contact a health care provider if:  You are unsure if you are in labor or if your water has broken.  You become dizzy.  You have mild pelvic cramps, pelvic pressure, or nagging pain in your abdominal area.  You have lower back pain.  You have persistent  nausea, vomiting, or diarrhea.  You have an unusual or bad smelling vaginal discharge.  You have pain when you urinate. Get help right away if:  Your water breaks before 37 weeks.  You have regular contractions less than 5 minutes apart before 37 weeks.  You have a fever.  You are leaking fluid from your vagina.  You have spotting or bleeding from your vagina.  You have severe abdominal pain or cramping.  You have rapid weight loss or weight gain.    You have shortness of breath with chest pain.  You notice sudden or extreme swelling of your face, hands, ankles, feet, or legs.  Your baby makes fewer than 10 movements in 2 hours.  You have severe headaches that do not go away when you take medicine.  You have vision changes. Summary  The third trimester is from week 28 through week 40, months 7 through 9. The third trimester is a time when the unborn baby (fetus) is growing rapidly.  During the third trimester, your discomfort may increase as you and your baby continue to gain weight. You may have abdominal, leg, and back pain, sleeping problems, and an increased need to urinate.  During the third trimester your breasts will keep growing and they will continue to become tender. A yellow fluid (colostrum) may leak from your breasts. This is the first milk you are producing for your baby.  False labor is a condition in which you feel small, irregular tightenings of the muscles in the womb (contractions) that eventually go away. These are called Braxton Hicks contractions. Contractions may last for hours, days, or even weeks before true labor sets in.  Signs of labor can include: abdominal cramps; regular contractions that start at 10 minutes apart and become stronger and more frequent with time; watery or bloody mucus discharge that comes from the vagina; increased pelvic pressure and dull back pain; and leaking of amniotic fluid. This information is not intended to replace advice  given to you by your health care provider. Make sure you discuss any questions you have with your health care provider. Document Released: 06/18/2001 Document Revised: 11/30/2015 Document Reviewed: 08/25/2012 Elsevier Interactive Patient Education  2017 [employer]  

## 2017-02-20 NOTE — Progress Notes (Signed)
GTT Today 17P injection Rhogam today

## 2017-02-20 NOTE — Addendum Note (Signed)
Addended by: Tresea MallGLEDHILL, Zayanna Pundt on: 02/20/2017 04:31 PM   Modules accepted: Orders

## 2017-02-20 NOTE — Progress Notes (Signed)
Reviewed weight gain and encouraged patient to eat healthy foods and stay active. 28 week labs today. 17P today. Rhogam today. ROB in 2 wks.

## 2017-02-20 NOTE — Addendum Note (Signed)
Addended by: Tresea MallGLEDHILL, Urvi Imes on: 02/20/2017 10:32 AM   Modules accepted: Orders

## 2017-02-20 NOTE — Addendum Note (Signed)
Addended by: [place]il[place]JORDAN, Jayvin Hurrell B on: 02/20/2017 09:45 AM   Modules accepted: Orders

## 2017-02-21 LAB — 28 WEEKS RH-PANEL
ANTIBODY SCREEN: NEGATIVE
BASOS ABS: 0 10*3/uL (ref 0.0–0.2)
Basos: 0 %
EOS (ABSOLUTE): 0.1 10*3/uL (ref 0.0–0.4)
Eos: 1 %
GESTATIONAL DIABETES SCREEN: 128 mg/dL (ref 65–139)
HIV Screen 4th Generation wRfx: NONREACTIVE
Hematocrit: 30.4 % — ABNORMAL LOW (ref 34.0–46.6)
Hemoglobin: 9.7 g/dL — ABNORMAL LOW (ref 11.1–15.9)
IMMATURE GRANS (ABS): 0 10*3/uL (ref 0.0–0.1)
Immature Granulocytes: 0 %
LYMPHS: 21 %
Lymphocytes Absolute: 1.6 10*3/uL (ref 0.7–3.1)
MCH: 26.9 pg (ref 26.6–33.0)
MCHC: 31.9 g/dL (ref 31.5–35.7)
MCV: 84 fL (ref 79–97)
MONOCYTES: 5 %
Monocytes Absolute: 0.4 10*3/uL (ref 0.1–0.9)
NEUTROS PCT: 73 %
Neutrophils Absolute: 5.6 10*3/uL (ref 1.4–7.0)
PLATELETS: 177 10*3/uL (ref 150–379)
RBC: 3.61 x10E6/uL — ABNORMAL LOW (ref 3.77–5.28)
RDW: 13.5 % (ref 12.3–15.4)
RPR Ser Ql: NONREACTIVE
WBC: 7.8 10*3/uL (ref 3.4–10.8)

## 2017-02-21 LAB — VARICELLA ZOSTER ANTIBODY, IGG: Varicella zoster IgG: 251 index (ref >165)

## 2017-02-21 LAB — RUBELLA SCREEN

## 2017-02-21 LAB — ANTIBODY SCREEN: ANTIBODY SCREEN: NEGATIVE

## 2017-02-27 ENCOUNTER — Ambulatory Visit (INDEPENDENT_AMBULATORY_CARE_PROVIDER_SITE_OTHER): Payer: Medicaid Other

## 2017-02-27 MED ORDER — HYDROXYPROGESTERONE CAPROATE 250 MG/ML IM OIL
250.0000 mg | TOPICAL_OIL | Freq: Once | INTRAMUSCULAR | Status: AC
  Administered 2017-02-27: 250 mg via INTRAMUSCULAR

## 2017-02-27 NOTE — Progress Notes (Signed)
Pt here for 17p which was given IM right glut.  NDC# [phone].  Pt c/o painful urination and sharp pain.  Appt sched for tomorrow.

## 2017-02-28 ENCOUNTER — Encounter: Payer: Self-pay | Admitting: Obstetrics and Gynecology

## 2017-02-28 ENCOUNTER — Other Ambulatory Visit: Payer: Self-pay | Admitting: Obstetrics and Gynecology

## 2017-02-28 ENCOUNTER — Ambulatory Visit (INDEPENDENT_AMBULATORY_CARE_PROVIDER_SITE_OTHER): Payer: Medicaid Other | Admitting: Obstetrics and Gynecology

## 2017-02-28 VITALS — BP 114/62 | Wt 190.0 lb

## 2017-02-28 DIAGNOSIS — Z3A29 29 weeks gestation of pregnancy: Secondary | ICD-10-CM

## 2017-02-28 DIAGNOSIS — O0990 Supervision of high risk pregnancy, unspecified, unspecified trimester: Secondary | ICD-10-CM

## 2017-02-28 DIAGNOSIS — R309 Painful micturition, unspecified: Secondary | ICD-10-CM

## 2017-02-28 DIAGNOSIS — O09291 Supervision of pregnancy with other poor reproductive or obstetric history, first trimester: Secondary | ICD-10-CM | POA: Diagnosis not present

## 2017-02-28 LAB — POCT URINALYSIS DIPSTICK
Bilirubin, UA: NEGATIVE
Blood, UA: NEGATIVE
Glucose, UA: NEGATIVE
Ketones, UA: NEGATIVE
NITRITE UA: NEGATIVE
PH UA: 6 (ref 5.0–8.0)
Spec Grav, UA: 1.01 (ref 1.010–1.025)
UROBILINOGEN UA: NEGATIVE U/dL — AB

## 2017-02-28 MED ORDER — FERROUS SULFATE 325 (65 FE) MG PO TABS: 325.0000 mg | ORAL_TABLET | Freq: Every day | ORAL | 3 refills | Status: DC

## 2017-02-28 MED ORDER — NITROFURANTOIN MONOHYD MACRO 100 MG PO CAPS: 100.0000 mg | ORAL_CAPSULE | Freq: Two times a day (BID) | ORAL | 1 refills | Status: DC

## 2017-02-28 NOTE — Progress Notes (Signed)
UTI?  Pain when urinates x 2 days (see UDIP)

## 2017-02-28 NOTE — Progress Notes (Signed)
Obstetrics & Gynecology Office Visit   Chief Complaint:  Chief Complaint  Patient presents with  . ROB/UTI    History of Present Illness: [name] is a 21 y.o. G3P1101 at [redacted]w[redacted]d, presents today for a problem visit.  She complains of sharp suprapubic pain with urination . She has had symptoms for 2 days. Patient also complains of no other complaints. Symptoms are moderate.  Patient denies contractions, vaginal bleeding, decreased fetal movement, burning with urinarion, CVA tenderness. Patient does not have a history of recurrent UTI,  does not have a history of pyelonephritis, does not have a history of nephrolithiasis.  She has not had previous treatment for her current symptoms.   Review of Systems: 10 point review of systems negative unless otherwise noted in HPI  Past Medical History:  Past Medical History:  Diagnosis Date  . Cervical incompetence   . Type A blood, Rh negative     Past Surgical History:  Past Surgical History:  Procedure Laterality Date  . NO PAST SURGERIES      Gynecologic History: Patient's last menstrual period was 08/08/2016 (approximate).  Obstetric History: [id number]  Family History:  Family History  Problem Relation Age of Onset  . Eclampsia Mother   . Breast cancer Neg Hx   . Ovarian cancer Neg Hx     Social History:  Social History   Social History  . Marital status: Single    Spouse name: N/A  . Number of children: 2  . Years of education: GED   Occupational History  .  [employer]   Social History Main Topics  . Smoking status: Never Smoker  . Smokeless tobacco: Never Used  . Alcohol use No  . Drug use: No  . Sexual activity: Yes    Partners: Male    Birth control/ protection: None   Other Topics Concern  . Not on file   Social History Narrative   Lives with boyfriend and dad in a 2 story home.  Has 1 son and is pregnant with a daughter.  Due in November 2018.  Works at [employer].  Education: GED.      Allergies:  No Known Allergies  Medications: Prior to Admission medications   Medication Sig Start Date End Date Taking? Authorizing Provider  ferrous sulfate (FERROUSUL) 325 (65 FE) MG tablet Take 1 tablet (325 mg total) by mouth daily with breakfast. 02/28/17   Vena Austria, MD  nitrofurantoin, macrocrystal-monohydrate, (MACROBID) 100 MG capsule Take 1 capsule (100 mg total) by mouth 2 (two) times daily. 02/28/17   Vena Austria, MD    Physical Exam Vitals:  Vitals:   02/28/17 1353  BP: 114/62   Patient's last menstrual period was 08/08/2016 (approximate).  General: NAD HEENT: normocephalic, anicteric Pulmonary: No increased work of breathing Abdomen: NABS, soft, non-tender, non-distended.  Umbilicus without lesions.  No hepatomegaly, splenomegaly or masses palpable. No evidence of hernia, +FHT Extremities: no edema, erythema, or tenderness Neurologic: Grossly intact Psychiatric: mood appropriate, affect full  Results for orders placed or performed in visit on 02/28/17 (from the past 24 hour(s))  POCT urinalysis dipstick     Status: Abnormal   Collection Time: 02/28/17  2:01 PM  Result Value Ref Range   Color, UA Gold    Clarity, UA Cloudy    Glucose, UA neg    Bilirubin, UA neg    Ketones, UA neg    Spec Grav, UA 1.010 1.010 - 1.025   Blood, UA  Neg    pH, UA 6.0 5.0 - 8.0   Protein, UA 1+    Urobilinogen, UA negative (A) 0.2 or 1.0 E.U./dL   Nitrite, UA Neg    Leukocytes, UA Small (1+) (A) Negative      Assessment: 21 y.o. G3P1101 at [redacted]w[redacted]d presenting with dysuria  Plan: Problem List Items Addressed This Visit      Other   Pregnancy - Primary   Relevant Orders   POCT urinalysis dipstick (Completed)   Urine Culture    Other Visit Diagnoses    Painful urination       Relevant Orders   POCT urinalysis dipstick (Completed)   Urine Culture      - Other than small leukocytes and +1 protein negative UA - no frequency or burning with urination,  we discussed pros and cons of starting macrobid prior to culture results.  Patient opts to proceed with treatment, will contact if requires change in antibiotic coverage although I anticipate negative culture . A total of 15 minutes were spent in face-to-face contact with the patient during this encounter with over half of that time devoted to counseling and coordination of care.

## 2017-03-02 LAB — URINE CULTURE

## 2017-03-03 ENCOUNTER — Encounter: Payer: Self-pay | Admitting: Obstetrics and Gynecology

## 2017-03-06 ENCOUNTER — Ambulatory Visit (INDEPENDENT_AMBULATORY_CARE_PROVIDER_SITE_OTHER): Payer: Medicaid Other | Admitting: Advanced Practice Midwife

## 2017-03-06 VITALS — BP 118/70 | Wt 187.0 lb

## 2017-03-06 DIAGNOSIS — O0990 Supervision of high risk pregnancy, unspecified, unspecified trimester: Secondary | ICD-10-CM

## 2017-03-06 DIAGNOSIS — O0932 Supervision of pregnancy with insufficient antenatal care, second trimester: Secondary | ICD-10-CM

## 2017-03-06 DIAGNOSIS — Z3A30 30 weeks gestation of pregnancy: Secondary | ICD-10-CM

## 2017-03-06 MED ORDER — HYDROXYPROGESTERONE CAPROATE 250 MG/ML IM OIL
250.0000 mg | TOPICAL_OIL | Freq: Once | INTRAMUSCULAR | Status: AC
  Administered 2017-03-06: 250 mg via INTRAMUSCULAR

## 2017-03-06 NOTE — Addendum Note (Signed)
Addended by: Liliane ShiSHAW, BEVERLY M on: 03/06/2017 03:43 PM   Modules accepted: Orders

## 2017-03-06 NOTE — Progress Notes (Signed)
TDAp today.

## 2017-03-06 NOTE — Progress Notes (Signed)
No s/s of UTI but has some sharp vaginal pains. Denies contractions, LOF. Had a couple episodes of pink spotting when wiping in the last week. 17P and Type and Screen/HBsAG today. Encouraged healthy foods/adequate hydration and stay active for healthy weight gain.

## 2017-03-07 LAB — ABO/RH: Rh Factor: NEGATIVE

## 2017-03-07 LAB — HEPATITIS B SURFACE ANTIBODY,QUALITATIVE: HEP B SURFACE AB, QUAL: NONREACTIVE

## 2017-03-08 ENCOUNTER — Observation Stay
Admission: EM | Admit: 2017-03-08 | Discharge: 2017-03-08 | Disposition: A | Discharge status: Home / Self Care | Payer: 59 | Attending: Obstetrics and Gynecology | Admitting: Obstetrics and Gynecology

## 2017-03-08 ENCOUNTER — Encounter: Payer: Self-pay | Admitting: *Deleted

## 2017-03-08 DIAGNOSIS — Z3A30 30 weeks gestation of pregnancy: Secondary | ICD-10-CM | POA: Diagnosis not present

## 2017-03-08 DIAGNOSIS — O09299 Supervision of pregnancy with other poor reproductive or obstetric history, unspecified trimester: Secondary | ICD-10-CM

## 2017-03-08 DIAGNOSIS — O4290 Premature rupture of membranes, unspecified as to length of time between rupture and onset of labor, unspecified weeks of gestation: Principal | ICD-10-CM | POA: Insufficient documentation

## 2017-03-08 HISTORY — DX: Anemia, unspecified: D64.9

## 2017-03-08 NOTE — OB Triage Note (Addendum)
Reports gush of fluid @ 1308 today. States she feels like she is still leaking. Not having to wear a pad. Clear fluid with "white chunks" reported. Elaina HoopsElks, Ademola Vert S

## 2017-03-08 NOTE — Progress Notes (Signed)
Nitrazine negative. Linda HoopsElks, Linda Choi S

## 2017-03-09 DIAGNOSIS — Z3A30 30 weeks gestation of pregnancy: Secondary | ICD-10-CM | POA: Diagnosis not present

## 2017-03-09 DIAGNOSIS — O479 False labor, unspecified: Secondary | ICD-10-CM | POA: Diagnosis not present

## 2017-03-09 NOTE — Discharge Summary (Signed)
Physician Final Progress Note  Patient ID: Linda Choi MRN: [id number] DOB/AGE: 19/12/1995 19 y.o.  Admit date: 03/08/2017 Admitting provider: Linzie Collinavid James Evans, MD Discharge date: 03/09/2017   Admission Diagnoses: leakage of fluid  Discharge Diagnoses:  Active Problems:   Indication for care in labor or delivery IUP at 30 weeks 3 days with reactive NST, membranes intact  History of Present Illness: The patient is a 21 y.o. female G3P1101 at 5962w3d who presents for an episode of fluid leaking about an hour before she arrived to L&D. The fluid was clear with white chunks per report. She has not worn a pad since the episode but said she feels like she is still leaking. She admits positive fetal movement. She denies vaginal bleeding or contractions.    Past Medical History:  Diagnosis Date  . Anemia   . Cervical incompetence   . Type A blood, Rh negative     Past Surgical History:  Procedure Laterality Date  . NO PAST SURGERIES      Current Facility-Administered Medications on File Prior to Encounter  Medication Dose Route Frequency Provider Last Rate Last Dose  . HYDROXYprogesterone Caproate SOAJ 275 mg  275 mg Subcutaneous Once Nadara MustardHarris, Robert P, MD      . HYDROXYprogesterone Caproate SOAJ 275 mg  275 mg Subcutaneous Once Nadara MustardHarris, Robert P, MD       Current Outpatient Prescriptions on File Prior to Encounter  Medication Sig Dispense Refill  . ferrous sulfate (FERROUSUL) 325 (65 FE) MG tablet Take 1 tablet (325 mg total) by mouth daily with breakfast. 30 tablet 3  . nitrofurantoin, macrocrystal-monohydrate, (MACROBID) 100 MG capsule Take 1 capsule (100 mg total) by mouth 2 (two) times daily. (Patient not taking: Reported on 03/08/2017) 14 capsule 1    No Known Allergies  Social History   Social History  . Marital status: Single    Spouse name: N/A  . Number of children: 2  . Years of education: GED   Occupational History  .  [employer]   Social History Main Topics   . Smoking status: Never Smoker  . Smokeless tobacco: Never Used  . Alcohol use No  . Drug use: No  . Sexual activity: Yes    Partners: Male    Birth control/ protection: None   Other Topics Concern  . Not on file   Social History Narrative   Lives with boyfriend and dad in a 2 story home.  Has 1 son and is pregnant with a daughter.  Due in November 2018.  Works [employer].  Education: GED.     Physical Exam: BP 119/66 (BP Location: Right Arm)   Pulse (!) 107   Temp 98.5 F (36.9 C) (Oral)   Resp 18   Ht 5\' 4"  (1.626 m)   Wt 187 lb (84.8 kg)   LMP 08/08/2016 (Approximate)   BMI 32.10 kg/m   Gen: NAD CV: RRR Pulm: CTAB Pelvic: nitrazine negative Toco: negative Fetal Well Being: 140 bpm, moderate variability, +accelerations, -decelerations Ext: no evidence of DVT  Consults: None  Significant Findings/ Diagnostic Studies: none  Procedures: NST  Discharge Condition: good  Disposition: 01-Home or Self Care  Diet: Regular diet  Discharge Activity: Activity as tolerated  Discharge Instructions    Discharge activity:  No Restrictions    Complete by:  As directed    Discharge diet:  No restrictions    Complete by:  As directed    Fetal Kick Count:  Lie on  our left side for one hour after a meal, and count the number of times your baby kicks.  If it is less than 5 times, get up, move around and drink some juice.  Repeat the test 30 minutes later.  If it is still less than 5 kicks in an hour, notify your doctor.    Complete by:  As directed    No sexual activity restrictions    Complete by:  As directed    Notify physician for a general feeling that "something is not right"    Complete by:  As directed    Notify physician for increase or change in vaginal discharge    Complete by:  As directed    Notify physician for intestinal cramps, with or without diarrhea, sometimes described as "gas pain"    Complete by:  As directed    Notify physician for leaking of  fluid    Complete by:  As directed    Notify physician for low, dull backache, unrelieved by heat or Tylenol    Complete by:  As directed    Notify physician for menstrual like cramps    Complete by:  As directed    Notify physician for pelvic pressure    Complete by:  As directed    Notify physician for uterine contractions.  These may be painless and feel like the uterus is tightening or the baby is  "balling up"    Complete by:  As directed    Notify physician for vaginal bleeding    Complete by:  As directed    PRETERM LABOR:  Includes any of the follwing symptoms that occur between 20 - [redacted] weeks gestation.  If these symptoms are not stopped, preterm labor can result in preterm delivery, placing your baby at risk    Complete by:  As directed      Allergies as of 03/08/2017   No Known Allergies     Medication List    STOP taking these medications   nitrofurantoin (macrocrystal-monohydrate) 100 MG capsule Commonly known as:  MACROBID     TAKE these medications   ferrous sulfate 325 (65 FE) MG tablet Commonly known as:  FERROUSUL Take 1 tablet (325 mg total) by mouth daily with breakfast.   hydroxyprogesterone caproate 250 mg/mL Oil injection Commonly known as:  MAKENA Inject 250 mg into the muscle once a week.                 Follow-up Information    Tresea Mall, CNM Follow up.   Specialty:  Obstetrics Why:  Keep next scheduled appointment. Contact information: [address] [place] [address] [phone]           Total time spent taking care of this patient: 15 minutes  Signed: Tresea Mall, CNM  03/09/2017, 7:44 AM

## 2017-03-12 ENCOUNTER — Encounter: Payer: Self-pay | Admitting: Obstetrics and Gynecology

## 2017-03-13 ENCOUNTER — Ambulatory Visit (INDEPENDENT_AMBULATORY_CARE_PROVIDER_SITE_OTHER): Payer: Medicaid Other | Admitting: Obstetrics and Gynecology

## 2017-03-13 ENCOUNTER — Telehealth: Payer: Self-pay

## 2017-03-13 ENCOUNTER — Ambulatory Visit: Payer: Self-pay

## 2017-03-13 VITALS — BP 124/52 | Wt 191.0 lb

## 2017-03-13 DIAGNOSIS — O4693 Antepartum hemorrhage, unspecified, third trimester: Secondary | ICD-10-CM | POA: Diagnosis not present

## 2017-03-13 DIAGNOSIS — O09299 Supervision of pregnancy with other poor reproductive or obstetric history, unspecified trimester: Secondary | ICD-10-CM

## 2017-03-13 DIAGNOSIS — Z3A31 31 weeks gestation of pregnancy: Secondary | ICD-10-CM | POA: Diagnosis not present

## 2017-03-13 DIAGNOSIS — Z8751 Personal history of pre-term labor: Secondary | ICD-10-CM | POA: Diagnosis not present

## 2017-03-13 DIAGNOSIS — Z6791 Unspecified blood type, Rh negative: Secondary | ICD-10-CM | POA: Diagnosis not present

## 2017-03-13 DIAGNOSIS — O0990 Supervision of high risk pregnancy, unspecified, unspecified trimester: Secondary | ICD-10-CM

## 2017-03-13 DIAGNOSIS — O26899 Other specified pregnancy related conditions, unspecified trimester: Secondary | ICD-10-CM

## 2017-03-13 DIAGNOSIS — O09899 Supervision of other high risk pregnancies, unspecified trimester: Secondary | ICD-10-CM | POA: Diagnosis not present

## 2017-03-13 DIAGNOSIS — O09213 Supervision of pregnancy with history of pre-term labor, third trimester: Secondary | ICD-10-CM

## 2017-03-13 DIAGNOSIS — O09293 Supervision of pregnancy with other poor reproductive or obstetric history, third trimester: Secondary | ICD-10-CM

## 2017-03-13 DIAGNOSIS — O360130 Maternal care for anti-D [Rh] antibodies, third trimester, not applicable or unspecified: Secondary | ICD-10-CM

## 2017-03-13 MED ORDER — HYDROXYPROGESTERONE CAPROATE 250 MG/ML IM OIL
250.0000 mg | TOPICAL_OIL | Freq: Once | INTRAMUSCULAR | Status: AC
  Administered 2017-03-13: 250 mg via INTRAMUSCULAR

## 2017-03-13 NOTE — Telephone Encounter (Signed)
Continued from MyChart message: I spoke to pt and she is aware Dr. Jean RosenthalJackson has agreed to see her this afternoon at 3:30 d/t she is HROB and having vaginal pressure and pink discharge. KJ CMA

## 2017-03-13 NOTE — Progress Notes (Signed)
  Routine Prenatal Care Visit  Subjective  Linda Choi is a 21 y.o. G3P1101 at 7570w1d being seen today for ongoing prenatal care.  She is currently monitored for the following issues for this high-risk pregnancy and has History of pregnancy loss in prior pregnancy, currently pregnant in first trimester; High risk pregnancy, antepartum; Rh negative state in antepartum period; History of macrosomia in infant in prior pregnancy, currently pregnant; Pregnancy; Indication for care in labor or delivery; and Vaginal bleeding in pregnancy, third trimester on her problem list.  ----------------------------------------------------------------------------------- Patient presents for a work-in visit for vaginal spotting. She has had episodes recently of vaginal spotting.  She is Rh negative and received rhogam on 02/20/17.  She notes two to three days of pink discharge.  She notes mild cramping.  She denies fevers, chills, urinary symptoms, vaginal symptoms, and GI symptoms.  She notes +FM, no LOF.  ----------------------------------------------------------------------------------- The following portions of the patient's history were reviewed and updated as appropriate: allergies, current medications, past family history, past medical history, past social history, past surgical history and problem list. Problem list updated.   Objective  Blood pressure (!) 124/52, weight 191 lb (86.6 kg), last menstrual period 08/08/2016, not currently breastfeeding. Pregravid weight 149 lb (67.6 kg) Total Weight Gain 42 lb (19.1 kg) Urinalysis: Urine Protein: Negative Urine Glucose: Negative Fetal Status: FHR 150  General:  Alert, oriented and cooperative. Patient is in no acute distress.  Skin: Skin is warm and dry. No rash noted.   Cardiovascular: Normal heart rate noted  Respiratory: Normal respiratory effort, no problems with respiration noted  Abdomen: Soft, gravid, appropriate for gestational age.  Pain/Pressure: Present     Pelvic:  NEFG, BUS normal, vaginal normal, scant to absent vaginal bleeding.   Dilation: Closed Effacement (%): 30 Station: Ballotable  Extremities: Normal range of motion.     Mental Status: Normal mood and affect. Normal behavior. Normal judgment and thought content.   Wet Prep: Ph: 5.0 Clue Cells: negative Trichomonas: negative Yeast: negative  Assessment   21 y.o. G3P1101 at 2470w1d by  05/15/2017, by Last Menstrual Period presenting for work-in prenatal visit  Plan   pregnancy  #3 Problems (from 10/03/16 to present)    Problem Noted Resolved   Vaginal bleeding in pregnancy, third trimester 03/13/2017 by Conard NovakJackson, Toshio Slusher D, MD No   History of macrosomia in infant in prior pregnancy, currently pregnant 01/23/2017 by Vena AustriaStaebler, Andreas, MD No   Overview Signed 01/23/2017 10:20 AM by Vena AustriaStaebler, Andreas, MD    [ ]  Growth scan at 36 weeks        -GBS, gonorrhea and chlamydia NAAT - u/s tomorrow for evidence of abruption.    Preterm labor symptoms and general obstetric precautions including but not limited to vaginal bleeding, contractions, leaking of fluid and fetal movement were reviewed in detail with the patient. Please refer to After Visit Summary for other counseling recommendations.   Return in about 1 day (around 03/14/2017) for ultrasound for vaginal bleeding and ROB after.  Thomasene MohairStephen Mechel Schutter, MD  03/14/2017 12:43 PM

## 2017-03-14 ENCOUNTER — Ambulatory Visit (INDEPENDENT_AMBULATORY_CARE_PROVIDER_SITE_OTHER): Payer: Medicaid Other | Admitting: Obstetrics and Gynecology

## 2017-03-14 ENCOUNTER — Observation Stay
Admission: EM | Admit: 2017-03-14 | Discharge: 2017-03-14 | Disposition: A | Discharge status: Home / Self Care | Payer: 59 | Attending: Obstetrics and Gynecology | Admitting: Obstetrics and Gynecology

## 2017-03-14 ENCOUNTER — Encounter: Payer: Medicaid Other | Admitting: Obstetrics and Gynecology

## 2017-03-14 ENCOUNTER — Ambulatory Visit (INDEPENDENT_AMBULATORY_CARE_PROVIDER_SITE_OTHER): Payer: Medicaid Other

## 2017-03-14 ENCOUNTER — Observation Stay: Payer: 59

## 2017-03-14 VITALS — BP 128/70 | Wt 190.0 lb

## 2017-03-14 DIAGNOSIS — O0990 Supervision of high risk pregnancy, unspecified, unspecified trimester: Secondary | ICD-10-CM

## 2017-03-14 DIAGNOSIS — O99013 Anemia complicating pregnancy, third trimester: Secondary | ICD-10-CM | POA: Insufficient documentation

## 2017-03-14 DIAGNOSIS — O4693 Antepartum hemorrhage, unspecified, third trimester: Secondary | ICD-10-CM | POA: Diagnosis not present

## 2017-03-14 DIAGNOSIS — O09299 Supervision of pregnancy with other poor reproductive or obstetric history, unspecified trimester: Secondary | ICD-10-CM

## 2017-03-14 DIAGNOSIS — Z3A30 30 weeks gestation of pregnancy: Secondary | ICD-10-CM

## 2017-03-14 DIAGNOSIS — Z6791 Unspecified blood type, Rh negative: Secondary | ICD-10-CM

## 2017-03-14 DIAGNOSIS — Z3A32 32 weeks gestation of pregnancy: Secondary | ICD-10-CM

## 2017-03-14 DIAGNOSIS — O09291 Supervision of pregnancy with other poor reproductive or obstetric history, first trimester: Secondary | ICD-10-CM

## 2017-03-14 DIAGNOSIS — Z79899 Other long term (current) drug therapy: Secondary | ICD-10-CM | POA: Insufficient documentation

## 2017-03-14 DIAGNOSIS — O26899 Other specified pregnancy related conditions, unspecified trimester: Secondary | ICD-10-CM

## 2017-03-14 DIAGNOSIS — O09899 Supervision of other high risk pregnancies, unspecified trimester: Secondary | ICD-10-CM

## 2017-03-14 LAB — POCT WET PREP WITH KOH
CLUE CELLS WET PREP PER HPF POC: NEGATIVE
KOH Prep POC: NEGATIVE
PH, VAGINAL: 5
TRICHOMONAS UA: NEGATIVE

## 2017-03-14 LAB — CBC
HCT: 32.6 % — ABNORMAL LOW (ref 35.0–47.0)
Hemoglobin: 11 g/dL — ABNORMAL LOW (ref 12.0–16.0)
MCH: 26.8 pg (ref 26.0–34.0)
MCHC: 33.8 g/dL (ref 32.0–36.0)
MCV: 79.4 fL — ABNORMAL LOW (ref 80.0–100.0)
Platelets: 155 10*3/uL (ref 150–440)
RBC: 4.11 MIL/uL (ref 3.80–5.20)
RDW: 16.6 % — AB (ref 11.5–14.5)
WBC: 11.1 10*3/uL — AB (ref 3.6–11.0)

## 2017-03-14 LAB — URINE DRUG SCREEN, QUALITATIVE (ARMC ONLY)
AMPHETAMINES, UR SCREEN: NOT DETECTED
BENZODIAZEPINE, UR SCRN: NOT DETECTED
Barbiturates, Ur Screen: NOT DETECTED
Cannabinoid 50 Ng, Ur ~~LOC~~: NOT DETECTED
Cocaine Metabolite,Ur ~~LOC~~: NOT DETECTED
MDMA (Ecstasy)Ur Screen: NOT DETECTED
METHADONE SCREEN, URINE: NOT DETECTED
OPIATE, UR SCREEN: NOT DETECTED
Phencyclidine (PCP) Ur S: NOT DETECTED
Tricyclic, Ur Screen: NOT DETECTED

## 2017-03-14 LAB — KLEIHAUER-BETKE STAIN
# VIALS RHIG: 1
FETAL CELLS %: 0 %
Quantitation Fetal Hemoglobin: 0 mL

## 2017-03-14 NOTE — Final Progress Note (Signed)
Physician Final Progress Note  Patient ID: Linda Choi MRN: [id number] DOB/AGE: 19/12/1995 19 y.o.  Admit date: 03/14/2017 Admitting provider: Conard NovakStephen D Gabryel Talamo, MD Discharge date: 03/14/2017   Admission Diagnoses:  1) intrauterine pregnancy at 7092w1d 2) vaginal bleeding in third trimester of pregnancy  Discharge Diagnoses:  1) intrauterine pregnancy at 292w1d 2) vaginal bleeding in third trimester of pregnancy  History of Present Illness: The patient is a 21 y.o. female G3P1101 at 9592w1d who presents for evaluation of vaginal bleeding during the last several days. She presented to clinic yesterday with the same complaint.  Workup for vaginal and cervical sources.  Negative workup, but some labs still pending.  She was scheduled for an ultrasound today to assess abruption, which was negative. She was sent to the hospital for further assessment. She noted +FM, no LOF, +VB, +ctx  Hospital Course: she was admitted to L&D for observation.  A CBC was normal. A KB was negative.  Growth scan showed 85th %ile and normal fluid no evidence of abruption or placenta previa.  Her fetal heart rate tracing was category 1 the entire time she was in observation.  She initially had contractions that were every two minutes. A cervical exam by the RN revealed closed cervix with no change from yesterday.  Her contractions dissipated and then stopped.  Her vitals were normal while in the hospital.  She was taken out of work for 2 weeks to assess improvement when not working.  She was given strict precautions to return to the hospital for increased bleeding, decreased fetal movement, painful contractions, and a gush of fluid.  She was also discharged on pelvic rest.    Past Medical History:  Diagnosis Date  . Anemia   . Cervical incompetence   . Type A blood, Rh negative     Past Surgical History:  Procedure Laterality Date  . NO PAST SURGERIES      No current facility-administered medications on file  prior to encounter.    Current Outpatient Prescriptions on File Prior to Encounter  Medication Sig Dispense Refill  . ferrous sulfate (FERROUSUL) 325 (65 FE) MG tablet Take 1 tablet (325 mg total) by mouth daily with breakfast. 30 tablet 3  . hydroxyprogesterone caproate (MAKENA) 250 mg/mL OIL injection Inject 250 mg into the muscle once a week.    . nitrofurantoin, macrocrystal-monohydrate, (MACROBID) 100 MG capsule Take 1 capsule (100 mg total) by mouth 2 (two) times daily. (Patient not taking: Reported on 03/08/2017) 14 capsule 1    No Known Allergies  Social History   Social History  . Marital status: Single    Spouse name: N/A  . Number of children: 2  . Years of education: GED   Occupational History  .  [employer]   Social History Main Topics  . Smoking status: Never Smoker  . Smokeless tobacco: Never Used  . Alcohol use No  . Drug use: No  . Sexual activity: Yes    Partners: Male    Birth control/ protection: None   Other Topics Concern  . Not on file   Social History Narrative   Lives with boyfriend and dad in a 2 story home.  Has 1 son and is pregnant with a daughter.  Due in November 2018.  Works [employer].  Education: GED.     Physical Exam: BP 113/62   Pulse 77   Temp 98.3 F (36.8 C) (Oral)   Resp 16   LMP 08/08/2016 (Approximate)  Gen: NAD CV: RRR Pulm: CTAB Pelvic: closed/30%/oop per RN Ext: no e/c/t  Consults: None  Significant Findings/ Diagnostic Studies:  KB: neg CBC: see labs  Procedures:  Growth and anatomy u/s: normal findings with growth in 85th%ile. No evidence of abruption or placenta previa.   NST Baseline FHR: 135 beats/min Variability: moderate Accelerations: present Decelerations: absent Tocometry: initially q 2 minutes, spacing out to rare  Interpretation:  INDICATIONS: vaginal bleeding RESULTS:  A NST procedure was performed with FHR monitoring and a normal baseline established, appropriate time of 20-40 minutes  of evaluation, and accels >2 seen w 15x15 characteristics.  Results show a REACTIVE NST.    Discharge Condition: stable  Disposition: 01-Home or Self Care  Diet: Regular diet  Discharge Activity: modified bedrest, out of work for 2 weeks, pelvic rest x 8 weeks  Discharge Instructions    Activity order    Complete by:  As directed    Pelvic rest for the next 8 weeks     Allergies as of 03/14/2017   No Known Allergies     Medication List    STOP taking these medications   nitrofurantoin (macrocrystal-monohydrate) 100 MG capsule Commonly known as:  MACROBID     TAKE these medications   ferrous sulfate 325 (65 FE) MG tablet Commonly known as:  FERROUSUL Take 1 tablet (325 mg total) by mouth daily with breakfast.   hydroxyprogesterone caproate 250 mg/mL Oil injection Commonly known as:  MAKENA Inject 250 mg into the muscle once a week.            Discharge Care Instructions        Start     Ordered   03/14/17 0000  Activity order    Comments:  Pelvic rest for the next 8 weeks   03/14/17 2023     Follow-up Information    [facility] Follow up on 03/20/2017.   Why:  Keep Previous Appointment Contact information: [address] [place] [address] [phone]          Total time spent taking care of this patient: 45 minutes  Signed: Thomasene Mohair, MD  03/14/2017, 8:24 PM

## 2017-03-14 NOTE — OB Triage Note (Signed)
[name] here after office visit, c/o vaginal bleeding, pt reports as "light pink", denies any other concerns. Reports positive movement.

## 2017-03-14 NOTE — Progress Notes (Signed)
U/S today Bleeding same as yesterday

## 2017-03-14 NOTE — Progress Notes (Signed)
    Routine Prenatal Care Visit  Subjective  Linda Choi is a 21 y.o. G3P1101 at 5326w1d being seen today for ongoing prenatal care.  She is currently monitored for the following issues for this high-risk pregnancy and has History of pregnancy loss in prior pregnancy, currently pregnant in first trimester; High risk pregnancy, antepartum; Rh negative state in antepartum period; History of macrosomia in infant in prior pregnancy, currently pregnant; Pregnancy; and Vaginal bleeding in pregnancy, third trimester on her problem list.  ----------------------------------------------------------------------------------- Patient reports vaginal bleeding.  Relatively light but has noted some increased cramping since 11:00 today.   Contractions: Irritability.  .  Movement: Present. Denies leaking of fluid.  ----------------------------------------------------------------------------------- The following portions of the patient's history were reviewed and updated as appropriate: allergies, current medications, past family history, past medical history, past social history, past surgical history and problem list. Problem list updated.   Objective  Blood pressure 128/70, weight 190 lb (86.2 kg), last menstrual period 08/08/2016, not currently breastfeeding. Pregravid weight 149 lb (67.6 kg) Total Weight Gain 41 lb (18.6 kg) Urinalysis:      Fetal Status: Fetal Heart Rate (bpm): 150   Movement: Present     General:  Alert, oriented and cooperative. Patient is in no acute distress.  Skin: Skin is warm and dry. No rash noted.   Cardiovascular: Normal heart rate noted  Respiratory: Normal respiratory effort, no problems with respiration noted  Abdomen: Soft, gravid, appropriate for gestational age. Pain/Pressure: Present     Pelvic:  Cervical exam deferred        Extremities: Normal range of motion.     ental Status: Normal mood and affect. Normal behavior. Normal judgment and thought content.      Assessment   21 y.o. G3P1101 at 2126w1d by  05/15/2017, by Last Menstrual Period presenting for work-in prenatal visit  Plan   pregnancy  #3 Problems (from 10/03/16 to present)    Problem Noted Resolved   Vaginal bleeding in pregnancy, third trimester 03/13/2017 by Conard NovakJackson, Stephen D, MD No   History of macrosomia in infant in prior pregnancy, currently pregnant 01/23/2017 by Vena AustriaStaebler, Nohea Kras, MD No   Overview Signed 01/23/2017 10:20 AM by Vena AustriaStaebler, Jyden Kromer, MD    [ ]  Growth scan at 36 weeks          Preterm labor symptoms and general obstetric precautions including but not limited to vaginal bleeding, contractions, leaking of fluid and fetal movement were reviewed in detail with the patient. Please refer to After Visit Summary for other counseling recommendations.  - given continued bleeding will send to L&D for KB and monitoring - discussed if negative likely etiology is cervicitis - Rh negative s/p rhogam 8/16  Return in about 1 week (around 03/21/2017) for ROB.

## 2017-03-14 NOTE — Discharge Summary (Signed)
See final progress note. 

## 2017-03-15 LAB — STREP GP B NAA: STREP GROUP B AG: NEGATIVE

## 2017-03-16 LAB — TYPE AND SCREEN
ABO/RH(D): A NEG
ANTIBODY SCREEN: POSITIVE
Unit division: 0
Unit division: 0

## 2017-03-16 LAB — BPAM RBC
BLOOD PRODUCT EXPIRATION DATE: 201810022359
BLOOD PRODUCT EXPIRATION DATE: 201810042359
UNIT TYPE AND RH: 600
Unit Type and Rh: 600

## 2017-03-17 LAB — GC/CHLAMYDIA PROBE AMP
CHLAMYDIA, DNA PROBE: NEGATIVE
NEISSERIA GONORRHOEAE BY PCR: NEGATIVE

## 2017-03-18 ENCOUNTER — Observation Stay
Admission: EM | Admit: 2017-03-18 | Discharge: 2017-03-18 | Disposition: A | Discharge status: Home / Self Care | Payer: 59 | Attending: Obstetrics & Gynecology | Admitting: Obstetrics & Gynecology

## 2017-03-18 DIAGNOSIS — Z79899 Other long term (current) drug therapy: Secondary | ICD-10-CM | POA: Insufficient documentation

## 2017-03-18 DIAGNOSIS — O4693 Antepartum hemorrhage, unspecified, third trimester: Secondary | ICD-10-CM

## 2017-03-18 DIAGNOSIS — Z3A32 32 weeks gestation of pregnancy: Secondary | ICD-10-CM | POA: Insufficient documentation

## 2017-03-18 DIAGNOSIS — N939 Abnormal uterine and vaginal bleeding, unspecified: Secondary | ICD-10-CM | POA: Diagnosis present

## 2017-03-18 DIAGNOSIS — O368130 Decreased fetal movements, third trimester, not applicable or unspecified: Principal | ICD-10-CM | POA: Insufficient documentation

## 2017-03-18 DIAGNOSIS — O26853 Spotting complicating pregnancy, third trimester: Secondary | ICD-10-CM | POA: Diagnosis not present

## 2017-03-18 DIAGNOSIS — O09299 Supervision of pregnancy with other poor reproductive or obstetric history, unspecified trimester: Secondary | ICD-10-CM

## 2017-03-18 LAB — URINALYSIS, ROUTINE W REFLEX MICROSCOPIC
BACTERIA UA: NONE SEEN
Bilirubin Urine: NEGATIVE
GLUCOSE, UA: NEGATIVE mg/dL
HGB URINE DIPSTICK: NEGATIVE
Ketones, ur: 5 mg/dL — AB
NITRITE: NEGATIVE
PH: 5 (ref 5.0–8.0)
PROTEIN: 30 mg/dL — AB
SPECIFIC GRAVITY, URINE: 1.027 (ref 1.005–1.030)

## 2017-03-18 LAB — CHLAMYDIA/NGC RT PCR (ARMC ONLY)
Chlamydia Tr: DETECTED — AB
N GONORRHOEAE: NOT DETECTED

## 2017-03-18 MED ORDER — ACETAMINOPHEN 325 MG PO TABS: 650.0000 mg | ORAL_TABLET | ORAL | Status: DC | PRN

## 2017-03-18 NOTE — Progress Notes (Signed)
Physician Final Progress Note  Patient ID: Linda Choi MRN: [id number] DOB/AGE: 19/12/1995 19 y.o.  Admit date: 03/18/2017 Admitting provider: Nadara Mustardobert P Harris, MD Discharge date: 03/18/2017   Admission Diagnoses: Vaginal spotting, decreased fetal movement  Discharge Diagnoses:  Active Problems:   Vaginal spotting    History of Present Illness: The patient is a 21 y.o. female G3P1101 at 6831w0d who presents for pink/bloody discharge yesterday at home in the shower and light pink spotting today when using the toilet. She also stated that baby has not been moving as much as usual today.  10 point review of systems negative unless otherwise noted in HPI.  Past Medical History:  Diagnosis Date  . Anemia   . Cervical incompetence   . Type A blood, Rh negative     Past Surgical History:  Procedure Laterality Date  . NO PAST SURGERIES      No current facility-administered medications on file prior to encounter.    Current Outpatient Prescriptions on File Prior to Encounter  Medication Sig Dispense Refill  . ferrous sulfate (FERROUSUL) 325 (65 FE) MG tablet Take 1 tablet (325 mg total) by mouth daily with breakfast. 30 tablet 3  . hydroxyprogesterone caproate (MAKENA) 250 mg/mL OIL injection Inject 250 mg into the muscle once a week.      No Known Allergies  Social History   Social History  . Marital status: Single    Spouse name: N/A  . Number of children: 2  . Years of education: GED   Occupational History  .  [employer]   Social History Main Topics  . Smoking status: Never Smoker  . Smokeless tobacco: Never Used  . Alcohol use No  . Drug use: No  . Sexual activity: Yes    Partners: Male    Birth control/ protection: None   Other Topics Concern  . Not on file   Social History Narrative   Lives with boyfriend and dad in a 2 story home.  Has 1 son and is pregnant with a daughter.  Due in November 2018.  Works [employer].  Education: GED.      Physical Exam: BP (!) 121/59 (BP Location: Left Arm)   Pulse (!) 102   Temp 97.9 F (36.6 C) (Oral)   Resp 17   Ht 5\' 4"  (1.626 m)   Wt 190 lb (86.2 kg)   LMP 08/08/2016 (Approximate)   BMI 32.61 kg/m   Gen: NAD Resp: unlabored, no increased work of breathing Pelvic: SSE - Cervix normal appearing and visually closed, no blood in vaginal vault, physiological discharge present without signs of bleeding Ext: No evidence of DVT on exam  Fetal NST: Baseline: 155 bpm Variability: moderate Accelerations: present Decelerations: absent Tocometry: none The patient was monitored for 30+ minutes, fetal heart rate tracing was deemed reactive, category I tracing.   Consults: None  Significant Findings/ Diagnostic Studies: None, will call patient and treat if GC or urine culture show abnormal results.  Procedures: SSE, see above  Discharge Condition: stable  Disposition: 01-Home or Self Care  Diet: Regular diet  Discharge Activity: Activity as tolerated  Discharge Instructions    Discharge activity:  No Restrictions    Complete by:  As directed    Discharge diet:  No restrictions    Complete by:  As directed    Fetal Kick Count:  Lie on our left side for one hour after a meal, and count the number of times your baby kicks.  If it is less than 5 times, get up, move around and drink some juice.  Repeat the test 30 minutes later.  If it is still less than 5 kicks in an hour, notify your doctor.    Complete by:  As directed    Notify physician for a general feeling that "something is not right"    Complete by:  As directed    Notify physician for increase or change in vaginal discharge    Complete by:  As directed    Notify physician for intestinal cramps, with or without diarrhea, sometimes described as "gas pain"    Complete by:  As directed    Notify physician for leaking of fluid    Complete by:  As directed    Notify physician for low, dull backache, unrelieved by heat  or Tylenol    Complete by:  As directed    Notify physician for menstrual like cramps    Complete by:  As directed    Notify physician for pelvic pressure    Complete by:  As directed    Notify physician for uterine contractions.  These may be painless and feel like the uterus is tightening or the baby is  "balling up"    Complete by:  As directed    Notify physician for vaginal bleeding    Complete by:  As directed    PRETERM LABOR:  Includes any of the follwing symptoms that occur between 20 - [redacted] weeks gestation.  If these symptoms are not stopped, preterm labor can result in preterm delivery, placing your baby at risk    Complete by:  As directed    Sexual Activity:      Complete by:  As directed    Continue pelvic rest for 8 weeks.     Allergies as of 03/18/2017   No Known Allergies     Medication List    TAKE these medications   ferrous sulfate 325 (65 FE) MG tablet Commonly known as:  FERROUSUL Take 1 tablet (325 mg total) by mouth daily with breakfast.   hydroxyprogesterone caproate 250 mg/mL Oil injection Commonly known as:  MAKENA Inject 250 mg into the muscle once a week.            Discharge Care Instructions        Start     Ordered   03/18/17 0000  PRETERM LABOR:  Includes any of the follwing symptoms that occur between 20 - [redacted] weeks gestation.  If these symptoms are not stopped, preterm labor can result in preterm delivery, placing your baby at risk  (Preterm Labor Notify MD)     03/18/17 1651   03/18/17 0000  Notify physician for menstrual like cramps  (Preterm Labor Notify MD)     03/18/17 1651   03/18/17 0000  Notify physician for uterine contractions.  These may be painless and feel like the uterus is tightening or the baby is  "balling up"  (Preterm Labor Notify MD)     03/18/17 1651   03/18/17 0000  Notify physician for low, dull backache, unrelieved by heat or Tylenol  (Preterm Labor Notify MD)     03/18/17 1651   03/18/17 0000  Notify physician  for intestinal cramps, with or without diarrhea, sometimes described as "gas pain"  (Preterm Labor Notify MD)     03/18/17 1651   03/18/17 0000  Notify physician for pelvic pressure  (Preterm Labor Notify MD)     03/18/17 1651  03/18/17 0000  Notify physician for increase or change in vaginal discharge  (Preterm Labor Notify MD)     03/18/17 1651   03/18/17 0000  Notify physician for vaginal bleeding  (Preterm Labor Notify MD)     03/18/17 1651   03/18/17 0000  Notify physician for a general feeling that "something is not right"  (Preterm Labor Notify MD)     03/18/17 1651   03/18/17 0000  Notify physician for leaking of fluid  (Preterm Labor Notify MD)     03/18/17 1651   03/18/17 0000  Discharge activity:  No Restrictions     03/18/17 1651   03/18/17 0000  Discharge diet:  No restrictions     03/18/17 1651   03/18/17 0000  Fetal Kick Count:  Lie on our left side for one hour after a meal, and count the number of times your baby kicks.  If it is less than 5 times, get up, move around and drink some juice.  Repeat the test 30 minutes later.  If it is still less than 5 kicks in an hour, notify your doctor.     03/18/17 1651   03/18/17 0000  Sexual Activity:       03/18/17 1651      Total time spent taking care of this patient: 15 minutes  Signed: Oswaldo Conroy, CNM  03/18/2017, 5:15 PM

## 2017-03-18 NOTE — OB Triage Note (Signed)
Patient presented to L&D from ED with complaints of vaginal spotting/ bleeding that happened yesterday afternoon in the shower. Patient states she has since been having light pink vaginal spotting when she is up to the bathroom. States baby has been moving less than normal today.

## 2017-03-18 NOTE — Discharge Summary (Signed)
See final progress note 03/18/2017.  Linda BruinsJacelyn Kailynn Choi, CNM 03/18/2017  5:17 PM

## 2017-03-19 ENCOUNTER — Telehealth: Payer: Self-pay | Admitting: Maternal Newborn

## 2017-03-19 ENCOUNTER — Other Ambulatory Visit: Payer: Self-pay | Admitting: Maternal Newborn

## 2017-03-19 DIAGNOSIS — O98813 Other maternal infectious and parasitic diseases complicating pregnancy, third trimester: Principal | ICD-10-CM

## 2017-03-19 DIAGNOSIS — A749 Chlamydial infection, unspecified: Secondary | ICD-10-CM | POA: Insufficient documentation

## 2017-03-19 DIAGNOSIS — O98819 Other maternal infectious and parasitic diseases complicating pregnancy, unspecified trimester: Secondary | ICD-10-CM

## 2017-03-19 MED ORDER — AZITHROMYCIN 500 MG PO TABS: 1000.0000 mg | ORAL_TABLET | Freq: Once | ORAL | 0 refills | Status: AC

## 2017-03-20 ENCOUNTER — Ambulatory Visit (INDEPENDENT_AMBULATORY_CARE_PROVIDER_SITE_OTHER): Payer: Medicaid Other | Admitting: Obstetrics & Gynecology

## 2017-03-20 DIAGNOSIS — O0990 Supervision of high risk pregnancy, unspecified, unspecified trimester: Secondary | ICD-10-CM

## 2017-03-20 DIAGNOSIS — O98813 Other maternal infectious and parasitic diseases complicating pregnancy, third trimester: Secondary | ICD-10-CM

## 2017-03-20 DIAGNOSIS — A749 Chlamydial infection, unspecified: Secondary | ICD-10-CM

## 2017-03-20 LAB — URINE CULTURE

## 2017-03-20 MED ORDER — HYDROXYPROGESTERONE CAPROATE 250 MG/ML IM OIL
250.0000 mg | TOPICAL_OIL | Freq: Once | INTRAMUSCULAR | Status: AC
  Administered 2017-03-20: 250 mg via INTRAMUSCULAR

## 2017-03-20 NOTE — Progress Notes (Signed)
In hospital for vag bleeding 2 days ago, none now.    Pos test for chlamydia, wants to be retested today before treating.  Concerned as she believes she and partner are monogamous.    Aptima today PTL precautions.  17P. PNV, FMC.  Breast feeding plans.  Unsure BC.

## 2017-03-20 NOTE — Patient Instructions (Signed)
Third Trimester of Pregnancy The third trimester is from week 28 through week 40 (months 7 through 9). The third trimester is a time when the unborn baby (fetus) is growing rapidly. At the end of the ninth month, the fetus is about 20 inches in length and weighs 6-10 pounds. Body changes during your third trimester Your body will continue to go through many changes during pregnancy. The changes vary from woman to woman. During the third trimester:  Your weight will continue to increase. You can expect to gain 25-35 pounds (11-16 kg) by the end of the pregnancy.  You may begin to get stretch marks on your hips, abdomen, and breasts.  You may urinate more often because the fetus is moving lower into your pelvis and pressing on your bladder.  You may develop or continue to have heartburn. This is caused by increased hormones that slow down muscles in the digestive tract.  You may develop or continue to have constipation because increased hormones slow digestion and cause the muscles that push waste through your intestines to relax.  You may develop hemorrhoids. These are swollen veins (varicose veins) in the rectum that can itch or be painful.  You may develop swollen, bulging veins (varicose veins) in your legs.  You may have increased body aches in the pelvis, back, or thighs. This is due to weight gain and increased hormones that are relaxing your joints.  You may have changes in your hair. These can include thickening of your hair, rapid growth, and changes in texture. Some women also have hair loss during or after pregnancy, or hair that feels dry or thin. Your hair will most likely return to normal after your baby is born.  Your breasts will continue to grow and they will continue to become tender. A yellow fluid (colostrum) may leak from your breasts. This is the first milk you are producing for your baby.  Your belly button may stick out.  You may notice more swelling in your hands,  face, or ankles.  You may have increased tingling or numbness in your hands, arms, and legs. The skin on your belly may also feel numb.  You may feel short of breath because of your expanding uterus.  You may have more problems sleeping. This can be caused by the size of your belly, increased need to urinate, and an increase in your body's metabolism.  You may notice the fetus "dropping," or moving lower in your abdomen (lightening).  You may have increased vaginal discharge.  You may notice your joints feel loose and you may have pain around your pelvic bone.  What to expect at prenatal visits You will have prenatal exams every 2 weeks until week 36. Then you will have weekly prenatal exams. During a routine prenatal visit:  You will be weighed to make sure you and the baby are growing normally.  Your blood pressure will be taken.  Your abdomen will be measured to track your baby's growth.  The fetal heartbeat will be listened to.  Any test results from the previous visit will be discussed.  You may have a cervical check near your due date to see if your cervix has softened or thinned (effaced).  You will be tested for Group B streptococcus. This happens between 35 and 37 weeks.  Your health care provider may ask you:  What your birth plan is.  How you are feeling.  If you are feeling the baby move.  If you have had   any abnormal symptoms, such as leaking fluid, bleeding, severe headaches, or abdominal cramping.  If you are using any tobacco products, including cigarettes, chewing tobacco, and electronic cigarettes.  If you have any questions.  Other tests or screenings that may be performed during your third trimester include:  Blood tests that check for low iron levels (anemia).  Fetal testing to check the health, activity level, and growth of the fetus. Testing is done if you have certain medical conditions or if there are problems during the  pregnancy.  Nonstress test (NST). This test checks the health of your baby to make sure there are no signs of problems, such as the baby not getting enough oxygen. During this test, a belt is placed around your belly. The baby is made to move, and its heart rate is monitored during movement.  What is false labor? False labor is a condition in which you feel small, irregular tightenings of the muscles in the womb (contractions) that usually go away with rest, changing position, or drinking water. These are called Braxton Hicks contractions. Contractions may last for hours, days, or even weeks before true labor sets in. If contractions come at regular intervals, become more frequent, increase in intensity, or become painful, you should see your health care provider. What are the signs of labor?  Abdominal cramps.  Regular contractions that start at 10 minutes apart and become stronger and more frequent with time.  Contractions that start on the top of the uterus and spread down to the lower abdomen and back.  Increased pelvic pressure and dull back pain.  A watery or bloody mucus discharge that comes from the vagina.  Leaking of amniotic fluid. This is also known as your "water breaking." It could be a slow trickle or a gush. Let your health care provider know if it has a color or strange odor. If you have any of these signs, call your health care provider right away, even if it is before your due date. Follow these instructions at home: Medicines  Follow your health care provider's instructions regarding medicine use. Specific medicines may be either safe or unsafe to take during pregnancy.  Take a prenatal vitamin that contains at least 600 micrograms (mcg) of folic acid.  If you develop constipation, try taking a stool softener if your health care provider approves. Eating and drinking  Eat a balanced diet that includes fresh fruits and vegetables, whole grains, good sources of protein  such as meat, eggs, or tofu, and low-fat dairy. Your health care provider will help you determine the amount of weight gain that is right for you.  Avoid raw meat and uncooked cheese. These carry germs that can cause birth defects in the baby.  If you have low calcium intake from food, talk to your health care provider about whether you should take a daily calcium supplement.  Eat four or five small meals rather than three large meals a day.  Limit foods that are high in fat and processed sugars, such as fried and sweet foods.  To prevent constipation: ? Drink enough fluid to keep your urine clear or pale yellow. ? Eat foods that are high in fiber, such as fresh fruits and vegetables, whole grains, and beans. Activity  Exercise only as directed by your health care provider. Most women can continue their usual exercise routine during pregnancy. Try to exercise for 30 minutes at least 5 days a week. Stop exercising if you experience uterine contractions.  Avoid heavy   lifting.  Do not exercise in extreme heat or humidity, or at high altitudes.  Wear low-heel, comfortable shoes.  Practice good posture.  You may continue to have sex unless your health care provider tells you otherwise. Relieving pain and discomfort  Take frequent breaks and rest with your legs elevated if you have leg cramps or low back pain.  Take warm sitz baths to soothe any pain or discomfort caused by hemorrhoids. Use hemorrhoid cream if your health care provider approves.  Wear a good support bra to prevent discomfort from breast tenderness.  If you develop varicose veins: ? Wear support pantyhose or compression stockings as told by your healthcare provider. ? Elevate your feet for 15 minutes, 3-4 times a day. Prenatal care  Write down your questions. Take them to your prenatal visits.  Keep all your prenatal visits as told by your health care provider. This is important. Safety  Wear your seat belt at  all times when driving.  Make a list of emergency phone numbers, including numbers for family, friends, the hospital, and police and fire departments. General instructions  Avoid cat litter boxes and soil used by cats. These carry germs that can cause birth defects in the baby. If you have a cat, ask someone to clean the litter box for you.  Do not travel far distances unless it is absolutely necessary and only with the approval of your health care provider.  Do not use hot tubs, steam rooms, or saunas.  Do not drink alcohol.  Do not use any products that contain nicotine or tobacco, such as cigarettes and e-cigarettes. If you need help quitting, ask your health care provider.  Do not use any medicinal herbs or unprescribed drugs. These chemicals affect the formation and growth of the baby.  Do not douche or use tampons or scented sanitary pads.  Do not cross your legs for long periods of time.  To prepare for the arrival of your baby: ? Take prenatal classes to understand, practice, and ask questions about labor and delivery. ? Make a trial run to the hospital. ? Visit the hospital and tour the maternity area. ? Arrange for maternity or paternity leave through employers. ? Arrange for family and friends to take care of pets while you are in the hospital. ? Purchase a rear-facing car seat and make sure you know how to install it in your car. ? Pack your hospital bag. ? Prepare the baby's nursery. Make sure to remove all pillows and stuffed animals from the baby's crib to prevent suffocation.  Visit your dentist if you have not gone during your pregnancy. Use a soft toothbrush to brush your teeth and be gentle when you floss. Contact a health care provider if:  You are unsure if you are in labor or if your water has broken.  You become dizzy.  You have mild pelvic cramps, pelvic pressure, or nagging pain in your abdominal area.  You have lower back pain.  You have persistent  nausea, vomiting, or diarrhea.  You have an unusual or bad smelling vaginal discharge.  You have pain when you urinate. Get help right away if:  Your water breaks before 37 weeks.  You have regular contractions less than 5 minutes apart before 37 weeks.  You have a fever.  You are leaking fluid from your vagina.  You have spotting or bleeding from your vagina.  You have severe abdominal pain or cramping.  You have rapid weight loss or weight gain.    You have shortness of breath with chest pain.  You notice sudden or extreme swelling of your face, hands, ankles, feet, or legs.  Your baby makes fewer than 10 movements in 2 hours.  You have severe headaches that do not go away when you take medicine.  You have vision changes. Summary  The third trimester is from week 28 through week 40, months 7 through 9. The third trimester is a time when the unborn baby (fetus) is growing rapidly.  During the third trimester, your discomfort may increase as you and your baby continue to gain weight. You may have abdominal, leg, and back pain, sleeping problems, and an increased need to urinate.  During the third trimester your breasts will keep growing and they will continue to become tender. A yellow fluid (colostrum) may leak from your breasts. This is the first milk you are producing for your baby.  False labor is a condition in which you feel small, irregular tightenings of the muscles in the womb (contractions) that eventually go away. These are called Braxton Hicks contractions. Contractions may last for hours, days, or even weeks before true labor sets in.  Signs of labor can include: abdominal cramps; regular contractions that start at 10 minutes apart and become stronger and more frequent with time; watery or bloody mucus discharge that comes from the vagina; increased pelvic pressure and dull back pain; and leaking of amniotic fluid. This information is not intended to replace advice  given to you by your health care provider. Make sure you discuss any questions you have with your health care provider. Document Released: 06/18/2001 Document Revised: 11/30/2015 Document Reviewed: 08/25/2012 Elsevier Interactive Patient Education  2017 [employer]  

## 2017-03-20 NOTE — Progress Notes (Signed)
Yes, she returned my call yesterday around 11 am and said she would pick up script/encourage partner to get tx.

## 2017-03-20 NOTE — Progress Notes (Signed)
Is this taken care of yet?

## 2017-03-20 NOTE — Addendum Note (Signed)
Addended by: Cornelius MorasPATTERSON, Chundra Sauerwein D on: 03/20/2017 02:43 PM   Modules accepted: Orders

## 2017-03-20 NOTE — Telephone Encounter (Signed)
-----   Message from Nadara Mustardobert P Harris, MD sent at 03/19/2017 12:38 AM EDT -----   ----- Message ----- From: Leory PlowmanInterface, [facility] [place] Sent: 03/18/2017  10:13 PM To: Nadara Mustardobert P Harris, MD

## 2017-03-22 LAB — GC/CHLAMYDIA PROBE AMP
Chlamydia trachomatis, NAA: POSITIVE — AB
Neisseria gonorrhoeae by PCR: NEGATIVE

## 2017-03-24 NOTE — Progress Notes (Signed)
LM.  Needs to go ahead and take ABX for Chlamydia as previously prescribed.  TOC 4 weeks.

## 2017-03-25 ENCOUNTER — Other Ambulatory Visit: Payer: Self-pay | Admitting: Obstetrics & Gynecology

## 2017-03-25 ENCOUNTER — Telehealth: Payer: Self-pay

## 2017-03-25 MED ORDER — ESOMEPRAZOLE MAGNESIUM 20 MG PO CPDR: 20.0000 mg | DELAYED_RELEASE_CAPSULE | Freq: Every day | ORAL | 11 refills | Status: DC

## 2017-03-25 NOTE — Telephone Encounter (Signed)
Nexium eRx done

## 2017-03-25 NOTE — Telephone Encounter (Signed)
At last visit PH asked pt how her heartburn was and to let him know if it got worse.  Pt states it is worse and nothing helps.  Uses [facility] [address]  [phone]

## 2017-03-26 ENCOUNTER — Ambulatory Visit (INDEPENDENT_AMBULATORY_CARE_PROVIDER_SITE_OTHER): Payer: Medicaid Other | Admitting: Maternal Newborn

## 2017-03-26 VITALS — BP 114/50 | Wt 189.0 lb

## 2017-03-26 DIAGNOSIS — O09899 Supervision of other high risk pregnancies, unspecified trimester: Secondary | ICD-10-CM

## 2017-03-26 DIAGNOSIS — O09291 Supervision of pregnancy with other poor reproductive or obstetric history, first trimester: Secondary | ICD-10-CM

## 2017-03-26 DIAGNOSIS — O98813 Other maternal infectious and parasitic diseases complicating pregnancy, third trimester: Secondary | ICD-10-CM

## 2017-03-26 DIAGNOSIS — O09299 Supervision of pregnancy with other poor reproductive or obstetric history, unspecified trimester: Secondary | ICD-10-CM

## 2017-03-26 DIAGNOSIS — Z3A33 33 weeks gestation of pregnancy: Secondary | ICD-10-CM

## 2017-03-26 DIAGNOSIS — Z6791 Unspecified blood type, Rh negative: Secondary | ICD-10-CM

## 2017-03-26 DIAGNOSIS — O26899 Other specified pregnancy related conditions, unspecified trimester: Secondary | ICD-10-CM

## 2017-03-26 DIAGNOSIS — O0990 Supervision of high risk pregnancy, unspecified, unspecified trimester: Secondary | ICD-10-CM

## 2017-03-26 DIAGNOSIS — Z8751 Personal history of pre-term labor: Secondary | ICD-10-CM

## 2017-03-26 DIAGNOSIS — A749 Chlamydial infection, unspecified: Secondary | ICD-10-CM

## 2017-03-26 MED ORDER — HYDROXYPROGESTERONE CAPROATE 250 MG/ML IM OIL
250.0000 mg | TOPICAL_OIL | Freq: Once | INTRAMUSCULAR | Status: AC
  Administered 2017-03-26: 250 mg via INTRAMUSCULAR

## 2017-03-26 NOTE — Progress Notes (Signed)
    Routine Prenatal Care Visit  Subjective  Linda Choi is a 21 y.o. G3P1101 at [redacted]w[redacted]d being seen today for ongoing prenatal care.  She is currently monitored for the following issues for this high-risk pregnancy and has History of pregnancy loss in prior pregnancy, currently pregnant in first trimester; High risk pregnancy, antepartum; Rh negative state in antepartum period; History of macrosomia in infant in prior pregnancy, currently pregnant; Pregnancy; Vaginal bleeding in pregnancy, third trimester; Vaginal spotting; and Chlamydia infection affecting pregnancy on her problem list.  ----------------------------------------------------------------------------------- Patient reports heartburn and baby not moving as much today.  Contractions: Not present. Vag. Bleeding: None.  Movement: Present. Denies leaking of fluid.  ----------------------------------------------------------------------------------- The following portions of the patient's history were reviewed and updated as appropriate: allergies, current medications, past family history, past medical history, past social history, past surgical history and problem list. Problem list updated.  Objective  Blood pressure (!) 114/50, weight 189 lb (85.7 kg), last menstrual period 08/08/2016, not currently breastfeeding. Pregravid weight 149 lb (67.6 kg) Total Weight Gain 40 lb (18.1 kg) Urinalysis: Urine Protein: Negative Urine Glucose: Negative  Fetal Status: Fetal Heart Rate (bpm): 144   Movement: Present     General:  Alert, oriented and cooperative. Patient is in no acute distress.  Skin: Skin is warm and dry. No rash noted.   Cardiovascular: Normal heart rate noted  Respiratory: Normal respiratory effort, no problems with respiration noted  Abdomen: Soft, gravid, appropriate for gestational age. Pain/Pressure: Present     Pelvic:  Cervical exam deferred        Extremities: Normal range of motion.     Mental Status: Normal  mood and affect. Normal behavior. Normal judgment and thought content.   Fetal movement palpated on exam and FHT increased to 160s during movement.   Assessment   21 y.o. G3P1101 at [redacted]w[redacted]d by  05/13/2017, by Ultrasound presenting for routine prenatal visit.  Plan   pregnancy  #3 Problems (from 10/03/16 to present)    Problem Noted Resolved   Chlamydia infection affecting pregnancy 03/19/2017 by Oswaldo Conroy, CNM No   Vaginal bleeding in pregnancy, third trimester 03/13/2017 by Conard Novak, MD No   History of macrosomia in infant in prior pregnancy, currently pregnant 01/23/2017 by Vena Austria, MD No   Overview Signed 01/23/2017 10:20 AM by Vena Austria, MD     Growth scan at 36 weeks       FHT and fetal movement on exam reassuring, but advised patient to perform fetal kick counts tonight and be seen in triage as needed if decreased movement.  Encouraged patient to start Nexium Rx for heartburn symptoms.  Preterm labor symptoms and general obstetric precautions including but not limited to vaginal bleeding, contractions, leaking of fluid and fetal movement were reviewed in detail with the patient.  Return in about 2 weeks (around 04/09/2017) for ROB.  Marcelyn Bruins, CNM 03/26/2017  4:04 PM

## 2017-03-27 ENCOUNTER — Ambulatory Visit: Payer: Medicaid Other

## 2017-04-01 ENCOUNTER — Observation Stay
Admission: EM | Admit: 2017-04-01 | Discharge: 2017-04-01 | Disposition: A | Discharge status: Home / Self Care | Payer: 59 | Attending: Obstetrics and Gynecology | Admitting: Obstetrics and Gynecology

## 2017-04-01 DIAGNOSIS — B9689 Other specified bacterial agents as the cause of diseases classified elsewhere: Secondary | ICD-10-CM | POA: Diagnosis not present

## 2017-04-01 DIAGNOSIS — O98813 Other maternal infectious and parasitic diseases complicating pregnancy, third trimester: Secondary | ICD-10-CM

## 2017-04-01 DIAGNOSIS — O23593 Infection of other part of genital tract in pregnancy, third trimester: Principal | ICD-10-CM | POA: Insufficient documentation

## 2017-04-01 DIAGNOSIS — O4693 Antepartum hemorrhage, unspecified, third trimester: Secondary | ICD-10-CM

## 2017-04-01 DIAGNOSIS — O99013 Anemia complicating pregnancy, third trimester: Secondary | ICD-10-CM | POA: Insufficient documentation

## 2017-04-01 DIAGNOSIS — O26893 Other specified pregnancy related conditions, third trimester: Secondary | ICD-10-CM

## 2017-04-01 DIAGNOSIS — N760 Acute vaginitis: Secondary | ICD-10-CM

## 2017-04-01 DIAGNOSIS — A749 Chlamydial infection, unspecified: Secondary | ICD-10-CM

## 2017-04-01 DIAGNOSIS — Z3A34 34 weeks gestation of pregnancy: Secondary | ICD-10-CM | POA: Insufficient documentation

## 2017-04-01 DIAGNOSIS — O09299 Supervision of pregnancy with other poor reproductive or obstetric history, unspecified trimester: Secondary | ICD-10-CM

## 2017-04-01 DIAGNOSIS — R102 Pelvic and perineal pain: Secondary | ICD-10-CM | POA: Diagnosis not present

## 2017-04-01 LAB — URINALYSIS, ROUTINE W REFLEX MICROSCOPIC
BILIRUBIN URINE: NEGATIVE
Bacteria, UA: NONE SEEN
Glucose, UA: NEGATIVE mg/dL
HGB URINE DIPSTICK: NEGATIVE
Ketones, ur: NEGATIVE mg/dL
Nitrite: NEGATIVE
PH: 5 (ref 5.0–8.0)
Protein, ur: 30 mg/dL — AB
SPECIFIC GRAVITY, URINE: 1.025 (ref 1.005–1.030)

## 2017-04-01 LAB — FETAL FIBRONECTIN: Fetal Fibronectin: NEGATIVE

## 2017-04-01 LAB — WET PREP, GENITAL
SPERM: NONE SEEN
Trich, Wet Prep: NONE SEEN
Yeast Wet Prep HPF POC: NONE SEEN

## 2017-04-01 MED ORDER — ACETAMINOPHEN 325 MG PO TABS: 650.0000 mg | ORAL_TABLET | ORAL | Status: DC | PRN

## 2017-04-01 MED ORDER — METRONIDAZOLE 0.75 % VA GEL: 1.0000 | Freq: Every day | VAGINAL | 0 refills | Status: AC

## 2017-04-01 NOTE — Discharge Instructions (Signed)
Take all of your medication as directed.  Follow up with your provider as scheduled.

## 2017-04-01 NOTE — Discharge Summary (Signed)
See Final Progress Note for 04/01/2017 visit.  Marcelyn Bruins, CNM 04/01/2017  6:26 PM

## 2017-04-01 NOTE — Final Progress Note (Addendum)
Physician Final Progress Note  Patient ID: Linda Choi MRN: [id number] DOB/AGE: 1995/11/20 21 y.o.  Admit date: 04/01/2017 Admitting provider: Conard Novak, MD Discharge date: 04/01/2017   Admission Diagnoses: Indication for care in labor and delivery, antepartum  Discharge Diagnoses:  Active Problems:   Indication for care in labor and delivery, antepartum   Bacterial vaginosis    History of Present Illness: The patient is a 21 y.o. female G3P1101 at [redacted]w[redacted]d who presents for aching pelvic pain, sensation of pelvic pressure, and back pain that has been present since this morning and that she rates 7/10. She reports a watery vaginal discharge that she says has a greenish tint. Denies leaking fluid, vaginal bleeding, dysuria and hematuria.  10 point review of systems negative unless otherwise noted in HPI.  Past Medical History:  Diagnosis Date  . Anemia   . Cervical incompetence   . Type A blood, Rh negative     Past Surgical History:  Procedure Laterality Date  . NO PAST SURGERIES      No current facility-administered medications on file prior to encounter.    Current Outpatient Prescriptions on File Prior to Encounter  Medication Sig Dispense Refill  . esomeprazole (NEXIUM) 20 MG capsule Take 1 capsule (20 mg total) by mouth daily at 12 noon. 30 capsule 11  . ferrous sulfate (FERROUSUL) 325 (65 FE) MG tablet Take 1 tablet (325 mg total) by mouth daily with breakfast. 30 tablet 3  . hydroxyprogesterone caproate (MAKENA) 250 mg/mL OIL injection Inject 250 mg into the muscle once a week.      No Known Allergies  Social History   Social History  . Marital status: Single    Spouse name: N/A  . Number of children: 2  . Years of education: GED   Occupational History  .  [employer]   Social History Main Topics  . Smoking status: Never Smoker  . Smokeless tobacco: Never Used  . Alcohol use No  . Drug use: No  . Sexual activity: Yes    Partners: Male   Birth control/ protection: None   Other Topics Concern  . Not on file   Social History Narrative   Lives with boyfriend and dad in a 2 story home.  Has 1 son and is pregnant with a daughter.  Due in November 2018.  Works at [employer].  Education: GED.     Physical Exam: BP 121/71 (BP Location: Left Arm)   Pulse 85   Temp 98.2 F (36.8 C) (Oral)   Resp 16   LMP 08/08/2016 (Approximate)   Gen: NAD Pulm: Unlabored, no increased work of breathing GU/Pelvic: Normal external genitalia; SSE: cervix normal appearing and visually closed, large amount of thin, white, discharge present, no blood in vaginal vault Ext: No edema, tenderness, or erythema  Fetal NST: Baseline: 130 Variability: moderate Accelerations: present Decelerations: absent Tocometry: some irritability The patient was monitored for 30+ minutes, fetal heart rate tracing was deemed reactive, category I tracing.  Consults: None  Significant Findings/ Diagnostic Studies:   Amsel's Criteria for Bacterial Vaginosis Clinical diagnosis requires presence of 3 of the below 4:  1) Homogenous thin white discharge present 2) Presence of clue cells present 3) Vaginal pH >4.5 present 4) Positive whiff test present   Results for orders placed or performed during the hospital encounter of 04/01/17 (from the past 24 hour(s))  Wet prep, genital     Status: Abnormal   Collection Time: 04/01/17  4:17 PM  Result Value Ref Range   Yeast Wet Prep HPF POC NONE SEEN NONE SEEN   Trich, Wet Prep NONE SEEN NONE SEEN   Clue Cells Wet Prep HPF POC PRESENT (A) NONE SEEN   WBC, Wet Prep HPF POC MANY (A) NONE SEEN   Sperm NONE SEEN   Urinalysis, Routine w reflex microscopic     Status: Abnormal   Collection Time: 04/01/17  4:17 PM  Result Value Ref Range   Color, Urine AMBER (A) YELLOW   APPearance CLOUDY (A) CLEAR   Specific Gravity, Urine 1.025 1.005 - 1.030   pH 5.0 5.0 - 8.0   Glucose, UA NEGATIVE NEGATIVE mg/dL   Hgb urine  dipstick NEGATIVE NEGATIVE   Bilirubin Urine NEGATIVE NEGATIVE   Ketones, ur NEGATIVE NEGATIVE mg/dL   Protein, ur 30 (A) NEGATIVE mg/dL   Nitrite NEGATIVE NEGATIVE   Leukocytes, UA LARGE (A) NEGATIVE   RBC / HPF 6-30 0 - 5 RBC/hpf   WBC, UA TOO NUMEROUS TO COUNT 0 - 5 WBC/hpf   Bacteria, UA NONE SEEN NONE SEEN   Squamous Epithelial / LPF 6-30 (A) NONE SEEN   Mucus PRESENT    Ca Oxalate Crys, UA PRESENT   Fetal fibronectin     Status: None   Collection Time: 04/01/17  4:17 PM  Result Value Ref Range   Fetal Fibronectin NEGATIVE NEGATIVE   Appearance, FETFIB CLEAR CLEAR    Procedures: SSE, see above. Fetal fibronectin collected and result is negative. Urinalysis was equivocal consistent with a contaminated sample; urine culture sent and not yet resulted. Will notify patient and prescribe appropriate antibiotic therapy if results are positive for UTI.  Discharge Condition: stable  Disposition: 01-Home or Self Care  Diet: Regular diet  Discharge Activity: Activity as tolerated and continue pelvic rest.  Discharge Instructions    Discharge activity:  No Restrictions    Complete by:  As directed    Discharge diet:  No restrictions    Complete by:  As directed    Do not have sex or do anything that might make you have an orgasm    Complete by:  As directed    Notify physician for a general feeling that "something is not right"    Complete by:  As directed    Notify physician for increase or change in vaginal discharge    Complete by:  As directed    Notify physician for intestinal cramps, with or without diarrhea, sometimes described as "gas pain"    Complete by:  As directed    Notify physician for leaking of fluid    Complete by:  As directed    Notify physician for low, dull backache, unrelieved by heat or Tylenol    Complete by:  As directed    Notify physician for menstrual like cramps    Complete by:  As directed    Notify physician for pelvic pressure    Complete by:   As directed    Notify physician for uterine contractions.  These may be painless and feel like the uterus is tightening or the baby is  "balling up"    Complete by:  As directed    Notify physician for vaginal bleeding    Complete by:  As directed    PRETERM LABOR:  Includes any of the follwing symptoms that occur between 20 - [redacted] weeks gestation.  If these symptoms are not stopped, preterm labor can result in preterm delivery, placing your baby at risk  Complete by:  As directed      Allergies as of 04/01/2017   No Known Allergies     Medication List    TAKE these medications   esomeprazole 20 MG capsule Commonly known as:  NEXIUM Take 1 capsule (20 mg total) by mouth daily at 12 noon.   ferrous sulfate 325 (65 FE) MG tablet Commonly known as:  FERROUSUL Take 1 tablet (325 mg total) by mouth daily with breakfast.   hydroxyprogesterone caproate 250 mg/mL Oil injection Commonly known as:  MAKENA Inject 250 mg into the muscle once a week.   metroNIDAZOLE 0.75 % vaginal gel Commonly known as:  METROGEL Place 1 Applicatorful vaginally at bedtime.            Discharge Care Instructions        Start     Ordered   04/01/17 0000  PRETERM LABOR:  Includes any of the follwing symptoms that occur between 20 - [redacted] weeks gestation.  If these symptoms are not stopped, preterm labor can result in preterm delivery, placing your baby at risk  (Preterm Labor Notify MD)     04/01/17 1800   04/01/17 0000  Notify physician for menstrual like cramps  (Preterm Labor Notify MD)     04/01/17 1800   04/01/17 0000  Notify physician for uterine contractions.  These may be painless and feel like the uterus is tightening or the baby is  "balling up"  (Preterm Labor Notify MD)     04/01/17 1800   04/01/17 0000  Notify physician for low, dull backache, unrelieved by heat or Tylenol  (Preterm Labor Notify MD)     04/01/17 1800   04/01/17 0000  Notify physician for intestinal cramps, with or without  diarrhea, sometimes described as "gas pain"  (Preterm Labor Notify MD)     04/01/17 1800   04/01/17 0000  Notify physician for pelvic pressure  (Preterm Labor Notify MD)     04/01/17 1800   04/01/17 0000  Notify physician for increase or change in vaginal discharge  (Preterm Labor Notify MD)     04/01/17 1800   04/01/17 0000  Notify physician for vaginal bleeding  (Preterm Labor Notify MD)     04/01/17 1800   04/01/17 0000  Notify physician for a general feeling that "something is not right"  (Preterm Labor Notify MD)     04/01/17 1800   04/01/17 0000  Notify physician for leaking of fluid  (Preterm Labor Notify MD)     04/01/17 1800   04/01/17 0000  Discharge activity:  No Restrictions     04/01/17 1800   04/01/17 0000  Discharge diet:  No restrictions     04/01/17 1800   04/01/17 0000  Do not have sex or do anything that might make you have an orgasm     04/01/17 1800   04/01/17 0000  metroNIDAZOLE (METROGEL) 0.75 % vaginal gel  Daily at bedtime    Question:  Supervising Provider  Answer:  Vena Austria   04/01/17 1800      Total time spent taking care of this patient: 20 minutes  Signed: Oswaldo Conroy, CNM  04/01/2017, 6:03 PM

## 2017-04-01 NOTE — OB Triage Note (Signed)
Patient reports aching pain in pelvis and back and watery/green vaginal discharge. Denies presence of bleeding or contractions.

## 2017-04-02 LAB — MISC LABCORP TEST (SEND OUT): LABCORP TEST CODE: 183194

## 2017-04-03 ENCOUNTER — Ambulatory Visit (INDEPENDENT_AMBULATORY_CARE_PROVIDER_SITE_OTHER): Payer: Medicaid Other | Admitting: Obstetrics and Gynecology

## 2017-04-03 VITALS — BP 114/78 | Wt 192.0 lb

## 2017-04-03 DIAGNOSIS — O4693 Antepartum hemorrhage, unspecified, third trimester: Secondary | ICD-10-CM

## 2017-04-03 DIAGNOSIS — O98813 Other maternal infectious and parasitic diseases complicating pregnancy, third trimester: Secondary | ICD-10-CM

## 2017-04-03 DIAGNOSIS — Z3A34 34 weeks gestation of pregnancy: Secondary | ICD-10-CM

## 2017-04-03 DIAGNOSIS — O09299 Supervision of pregnancy with other poor reproductive or obstetric history, unspecified trimester: Secondary | ICD-10-CM

## 2017-04-03 DIAGNOSIS — O0990 Supervision of high risk pregnancy, unspecified, unspecified trimester: Secondary | ICD-10-CM

## 2017-04-03 DIAGNOSIS — A749 Chlamydial infection, unspecified: Secondary | ICD-10-CM

## 2017-04-03 LAB — URINE CULTURE

## 2017-04-03 NOTE — Progress Notes (Signed)
Routine Prenatal Care Visit Subjective  Linda Choi is a 21 y.o. G3P1101 at [redacted]w[redacted]d being seen today for ongoing prenatal care.  She is currently monitored for the following issues for this high-risk pregnancy and has History of pregnancy loss in prior pregnancy, currently pregnant in first trimester; High risk pregnancy, antepartum; Rh negative state in antepartum period; History of macrosomia in infant in prior pregnancy, currently pregnant; Pregnancy; Vaginal bleeding in pregnancy, third trimester; Chlamydia infection affecting pregnancy; and Bacterial vaginosis on her problem list.  ----------------------------------------------------------------------------------- Patient reports no complaints.   Contractions: Not present. Vag. Bleeding: None.  Movement: Present. Denies leaking of fluid.  Recent visit to L&D for discharge. So far taking therapy with no issues.   17OHP today ----------------------------------------------------------------------------------- The following portions of the patient's history were reviewed and updated as appropriate: allergies, current medications, past family history, past medical history, past social history, past surgical history and problem list. Problem list updated.  Objective  Blood pressure 114/78, weight 192 lb (87.1 kg), last menstrual period 08/08/2016, not currently breastfeeding. Pregravid weight 149 lb (67.6 kg) Total Weight Gain 43 lb (19.5 kg) Urinalysis: Urine Protein: Negative Urine Glucose: Negative Fetal Status: Fetal Heart Rate (bpm): 145 Fundal Height: 35 cm Movement: Present     General:  Alert, oriented and cooperative. Patient is in no acute distress.  Skin: Skin is warm and dry. No rash noted.   Cardiovascular: Normal heart rate noted  Respiratory: Normal respiratory effort, no problems with respiration noted  Abdomen: Soft, gravid, appropriate for gestational age. Pain/Pressure: Present     Pelvic:  Cervical exam deferred         Extremities: Normal range of motion.     Mental Status: Normal mood and affect. Normal behavior. Normal judgment and thought content.   Assessment   21 y.o. G3P1101 at [redacted]w[redacted]d by  05/13/2017, by Ultrasound presenting for routine prenatal visit  Plan   pregnancy  #3 Problems (from 10/03/16 to present)    Problem Noted Resolved   Chlamydia infection affecting pregnancy 03/19/2017 by Oswaldo Conroy, CNM No   Overview Signed 04/03/2017 10:54 AM by Conard Novak, MD     TOC early Octoboer      Vaginal bleeding in pregnancy, third trimester 03/13/2017 by Conard Novak, MD No   History of macrosomia in infant in prior pregnancy, currently pregnant 01/23/2017 by Vena Austria, MD No   Overview Signed 01/23/2017 10:20 AM by Vena Austria, MD     Growth scan at 36 weeks      High risk pregnancy, antepartum 10/03/2016 by Farrel Conners, CNM No   Overview Addendum 02/28/2017 12:57 PM by Vena Austria, MD     Clinic [facility] Prenatal Labs  Dating LMP = 10 week [place] Blood type:     Genetic Screen Quad screen negative Antibody:   Anatomic [place] Complete on 01/23/17 Rubella: Non-Immune  GTT Third trimester: 128 RPR: NR  Flu vaccine  HBsAg:     TDaP vaccine                                               Rhogam: 02/20/17 HIV: Negative  Baby Food  GBS: (For PCN allergy, check sensitivities)  Contraception  Pap: N/A  Circumcision    Pediatrician    Support Person     Needs 17P injections starting at 16 weeks         Growth u/s in two weeks.  Needs TOC for chlamydia in 2 weeks, too.    Preterm labor symptoms and general obstetric precautions including but not limited to vaginal bleeding, contractions, leaking of fluid and fetal movement were reviewed in detail with the patient. Please refer to After Visit Summary for other counseling recommendations.   Return in about 2 weeks (around 04/17/2017) for schedule growth/afi and  routine prenatal.  Thomasene Mohair, MD  04/03/2017 11:03 AM

## 2017-04-10 ENCOUNTER — Ambulatory Visit (INDEPENDENT_AMBULATORY_CARE_PROVIDER_SITE_OTHER): Payer: Medicaid Other

## 2017-04-10 DIAGNOSIS — O09213 Supervision of pregnancy with history of pre-term labor, third trimester: Secondary | ICD-10-CM

## 2017-04-10 MED ORDER — HYDROXYPROGESTERONE CAPROATE 250 MG/ML IM OIL
250.0000 mg | TOPICAL_OIL | Freq: Once | INTRAMUSCULAR | Status: AC
  Administered 2017-04-10: 250 mg via INTRAMUSCULAR

## 2017-04-10 NOTE — Progress Notes (Signed)
Pt here for Makena inj which was given in left glut.  NDC# [phone]

## 2017-04-17 ENCOUNTER — Encounter: Payer: Self-pay | Admitting: Obstetrics and Gynecology

## 2017-04-17 ENCOUNTER — Telehealth: Payer: Self-pay | Admitting: Obstetrics and Gynecology

## 2017-04-17 ENCOUNTER — Ambulatory Visit (INDEPENDENT_AMBULATORY_CARE_PROVIDER_SITE_OTHER): Payer: Medicaid Other | Admitting: Obstetrics and Gynecology

## 2017-04-17 ENCOUNTER — Ambulatory Visit (INDEPENDENT_AMBULATORY_CARE_PROVIDER_SITE_OTHER): Payer: Medicaid Other

## 2017-04-17 VITALS — BP 112/68 | Wt 193.0 lb

## 2017-04-17 DIAGNOSIS — O0990 Supervision of high risk pregnancy, unspecified, unspecified trimester: Secondary | ICD-10-CM

## 2017-04-17 DIAGNOSIS — O09299 Supervision of pregnancy with other poor reproductive or obstetric history, unspecified trimester: Secondary | ICD-10-CM

## 2017-04-17 DIAGNOSIS — Z113 Encounter for screening for infections with a predominantly sexual mode of transmission: Secondary | ICD-10-CM

## 2017-04-17 DIAGNOSIS — Z23 Encounter for immunization: Secondary | ICD-10-CM

## 2017-04-17 DIAGNOSIS — Z3685 Encounter for antenatal screening for Streptococcus B: Secondary | ICD-10-CM

## 2017-04-17 DIAGNOSIS — Z8751 Personal history of pre-term labor: Secondary | ICD-10-CM

## 2017-04-17 DIAGNOSIS — Z3A36 36 weeks gestation of pregnancy: Secondary | ICD-10-CM

## 2017-04-17 MED ORDER — HYDROXYPROGESTERONE CAPROATE 275 MG/1.1ML ~~LOC~~ SOAJ: 275.0000 mg | Freq: Once | SUBCUTANEOUS | Status: DC

## 2017-04-17 MED ORDER — HYDROXYPROGESTERONE CAPROATE 250 MG/ML IM OIL
250.0000 mg | TOPICAL_OIL | Freq: Once | INTRAMUSCULAR | Status: AC
  Administered 2017-04-17: 250 mg via INTRAMUSCULAR

## 2017-04-17 NOTE — Progress Notes (Signed)
Routine Prenatal Care Visit  Subjective  Linda Choi is a 21 y.o. G3P1101 at [redacted]w[redacted]d being seen today for ongoing prenatal care.  She is currently monitored for the following issues for this high-risk pregnancy and has History of pregnancy loss in prior pregnancy, currently pregnant in first trimester; High risk pregnancy, antepartum; Rh negative state in antepartum period; History of macrosomia in infant in prior pregnancy, currently pregnant; Pregnancy; Vaginal bleeding in pregnancy, third trimester; Chlamydia infection affecting pregnancy; and Bacterial vaginosis on her problem list.  ----------------------------------------------------------------------------------- Patient reports no complaints.   Contractions: Irregular. Vag. Bleeding: None.  Movement: Present. Denies leaking of fluid.  ----------------------------------------------------------------------------------- The following portions of the patient's history were reviewed and updated as appropriate: allergies, current medications, past family history, past medical history, past social history, past surgical history and problem list. Problem list updated.   Objective  Blood pressure 112/68, weight 193 lb (87.5 kg), last menstrual period 08/08/2016, not currently breastfeeding. Pregravid weight 149 lb (67.6 kg) Total Weight Gain 44 lb (20 kg) Urinalysis: Urine Protein: Trace Urine Glucose: Negative  Fetal Status: Fetal Heart Rate (bpm): 140 Fundal Height: 36 cm Movement: Present     General:  Alert, oriented and cooperative. Patient is in no acute distress.  Skin: Skin is warm and dry. No rash noted.   Cardiovascular: Normal heart rate noted  Respiratory: Normal respiratory effort, no problems with respiration noted  Abdomen: Soft, gravid, appropriate for gestational age. Pain/Pressure: Present     Pelvic:  Cervical exam performed Dilation: 1 Effacement (%): 50 Station: -3  Extremities: Normal range of motion.       ental Status: Normal mood and affect. Normal behavior. Normal judgment and thought content.   Immunization History  Administered Date(s) Administered  . Influenza,inj,Quad PF,6+ Mos 04/17/2017  . Varicella 09/17/2015     Assessment   21 y.o. G3P1101 at [redacted]w[redacted]d by  05/13/2017, by Ultrasound presenting for routine prenatal visit  Plan   pregnancy  #3 Problems (from 10/03/16 to present)    Problem Noted Resolved   Chlamydia infection affecting pregnancy 03/19/2017 by Oswaldo Conroy, CNM No   Overview Signed 04/03/2017 10:54 AM by Conard Novak, MD     TOC early Octoboer      Vaginal bleeding in pregnancy, third trimester 03/13/2017 by Conard Novak, MD No   History of macrosomia in infant in prior pregnancy, currently pregnant 01/23/2017 by Vena Austria, MD No   Overview Signed 01/23/2017 10:20 AM by Vena Austria, MD     Growth scan at 36 weeks      High risk pregnancy, antepartum 10/03/2016 by Farrel Conners, CNM No   Overview Addendum 02/28/2017 12:57 PM by Vena Austria, MD     Clinic [facility] Prenatal Labs  Dating LMP = 10 week [place] Blood type:     Genetic Screen Quad screen negative Antibody:   Anatomic [place] Complete on 01/23/17 Rubella: Non-Immune  GTT Third trimester: 128 RPR: NR  Flu vaccine  HBsAg:     TDaP vaccine                                               Rhogam: 02/20/17 HIV: Negative  Baby Food  GBS: (For PCN allergy, check sensitivities)  Contraception  Pap: N/A  Circumcision    Pediatrician    Support Person     Needs 17P injections starting at 16 weeks             Term labor symptoms and general obstetric precautions including but not limited to vaginal bleeding, contractions, leaking of fluid and fetal movement were reviewed in detail with the patient. Please refer to After Visit Summary for other counseling recommendations.  - growth scan today 75%ile 7lbs 3oz but AC measuring  >95%ile - had previously discussed elective IOL at 39 week with SDJ - GBS and GC/CT obtained today - Influenza vaccionation Return in about 1 week (around 04/24/2017) for ROB.

## 2017-04-17 NOTE — Progress Notes (Signed)
ROB  17P GBS/APTIMA Pt has no concerns, flu vaccine given

## 2017-04-17 NOTE — Telephone Encounter (Signed)
Please call pt when you get a second she needed to ask you a question before you left her room. Tried to get you in between pt's but you were still in another room. Thanks!

## 2017-04-19 LAB — GC/CHLAMYDIA PROBE AMP
CHLAMYDIA, DNA PROBE: NEGATIVE
Neisseria gonorrhoeae by PCR: NEGATIVE

## 2017-04-19 LAB — STREP GP B NAA: Strep Gp B NAA: NEGATIVE

## 2017-04-21 ENCOUNTER — Encounter: Payer: Self-pay | Admitting: Obstetrics and Gynecology

## 2017-04-24 ENCOUNTER — Ambulatory Visit (INDEPENDENT_AMBULATORY_CARE_PROVIDER_SITE_OTHER): Payer: Medicaid Other | Admitting: Certified Nurse Midwife

## 2017-04-24 ENCOUNTER — Telehealth: Payer: Self-pay

## 2017-04-24 ENCOUNTER — Observation Stay
Admission: EM | Admit: 2017-04-24 | Discharge: 2017-04-25 | Disposition: A | Discharge status: Home / Self Care | Payer: 59 | Attending: Obstetrics and Gynecology | Admitting: Obstetrics and Gynecology

## 2017-04-24 VITALS — BP 102/62 | Wt 192.0 lb

## 2017-04-24 DIAGNOSIS — Z3A37 37 weeks gestation of pregnancy: Secondary | ICD-10-CM | POA: Diagnosis not present

## 2017-04-24 DIAGNOSIS — O471 False labor at or after 37 completed weeks of gestation: Principal | ICD-10-CM | POA: Insufficient documentation

## 2017-04-24 DIAGNOSIS — O09293 Supervision of pregnancy with other poor reproductive or obstetric history, third trimester: Secondary | ICD-10-CM

## 2017-04-24 DIAGNOSIS — A749 Chlamydial infection, unspecified: Secondary | ICD-10-CM | POA: Diagnosis not present

## 2017-04-24 DIAGNOSIS — O98913 Unspecified maternal infectious and parasitic disease complicating pregnancy, third trimester: Secondary | ICD-10-CM

## 2017-04-24 DIAGNOSIS — O26853 Spotting complicating pregnancy, third trimester: Secondary | ICD-10-CM | POA: Diagnosis not present

## 2017-04-24 DIAGNOSIS — O0990 Supervision of high risk pregnancy, unspecified, unspecified trimester: Secondary | ICD-10-CM

## 2017-04-24 MED ORDER — ACETAMINOPHEN 325 MG PO TABS: 650.0000 mg | ORAL_TABLET | ORAL | Status: DC | PRN

## 2017-04-24 NOTE — OB Triage Note (Signed)
Pt presents to L&D with c/o cramping, losing mucous plug and vaginal bleeding following appt at 0850 this am where sve was 3.5cm. Reports decreased fetal movement today and noticied vaginal bleeding in streaks of mucous. Denies leaking fluid. Also reports "period cramps" since appt. EFM and toco apllied. Plan to monitor fetal and maternal well being and assess for labor.

## 2017-04-24 NOTE — Telephone Encounter (Signed)
Pt was seen this am, cx ck'd, was told she might see blood once or twice today, states every time she wipes there is a lot of bloody mucous.  Should she come back in or go to the hosp.  [phone].  Per [facility] pt adv normal, if becomes more like a flow or has to wear a pad to be seen.

## 2017-04-24 NOTE — Final Progress Note (Signed)
Physician Final Progress Note  Patient ID: Linda Choi MRN: [id number] DOB/AGE: 19/12/1995 19 y.o.  Admit date: 04/24/2017 Admitting provider: Vena AustriaAndreas Marlayna Bannister, MD Discharge date: 04/24/2017   Admission Diagnoses:  Irregular contractions, lost mucous plug, spotting  Discharge Diagnoses:  Active Problems:   Labor and delivery indication for care or intervention  21 yo G3P1101 at 6130w2d presenting after cervical check in clinic today with contraction, lost mucous plug and spotting.  +FM, no LOF.  Cervix unchanged from clinic, irregular contractions, and reactive tracing.  pregnancy  #3 Problems (from 10/03/16 to present)    Problem Noted Resolved   Chlamydia infection affecting pregnancy 03/19/2017 by Oswaldo ConroySchmid, Jacelyn Y, CNM No   Overview Signed 04/03/2017 10:54 AM by Conard NovakJackson, Stephen D, MD    [ ]  TOC early Octoboer      Vaginal bleeding in pregnancy, third trimester 03/13/2017 by Conard NovakJackson, Stephen D, MD No   History of macrosomia in infant in prior pregnancy, currently pregnant 01/23/2017 by Vena AustriaStaebler, Curlee Bogan, MD No   Overview Signed 01/23/2017 10:20 AM by Vena AustriaStaebler, Ogle Antolin, MD    [ ]  Growth scan at 36 weeks      High risk pregnancy, antepartum 10/03/2016 by Farrel ConnersGutierrez, Colleen, CNM No   Overview Addendum 04/21/2017 12:29 PM by Vena AustriaStaebler, Armandina Iman, MD     Clinic [facility] Prenatal Labs  Dating LMP = 10 week US Blood type:     Genetic Screen Quad screen negative Antibody:   Anatomic US Complete on 01/23/17 Rubella: Non-Immune  GTT Third trimester: 128 RPR: NR  Flu vaccine 04/17/17 HBsAg: negative  TDaP vaccine                                               Rhogam: 02/20/17 HIV: Negative  Baby Food                                               NWG:[id number]:Negative  Contraception  Pap: N/A  Circumcision    Pediatrician    Support Person     Needs 17P injections starting at 16 weeks            Consults: None  Significant Findings/ Diagnostic Studies:  Baseline: 145 Variability:  moderate Accelerations: present Decelerations: absent Tocometry: irregular The patient was monitored for 30 minutes, fetal heart rate tracing was deemed reactive, category I tracing,  CPT [id number]   Procedures: none  Discharge Condition: good  Disposition: 01-Home or Self Care  Diet: Regular diet  Discharge Activity: Activity as tolerated  Discharge Instructions    Discharge activity:  No Restrictions    Complete by:  As directed    Discharge diet:  No restrictions    Complete by:  As directed    Fetal Kick Count:  Lie on our left side for one hour after a meal, and count the number of times your baby kicks.  If it is less than 5 times, get up, move around and drink some juice.  Repeat the test 30 minutes later.  If it is still less than 5 kicks in an hour, notify your doctor.    Complete by:  As directed    LABOR:  When conractions begin, you should start to time them from the beginning of one  contraction to the beginning  of the next.  When contractions are 5 - 10 minutes apart or less and have been regular for at least an hour, you should call your health care provider.    Complete by:  As directed    No sexual activity restrictions    Complete by:  As directed    Notify physician for bleeding from the vagina    Complete by:  As directed    Notify physician for blurring of vision or spots before the eyes    Complete by:  As directed    Notify physician for chills or fever    Complete by:  As directed    Notify physician for fainting spells, "black outs" or loss of consciousness    Complete by:  As directed    Notify physician for increase in vaginal discharge    Complete by:  As directed    Notify physician for leaking of fluid    Complete by:  As directed    Notify physician for pain or burning when urinating    Complete by:  As directed    Notify physician for pelvic pressure (sudden increase)    Complete by:  As directed    Notify physician for severe or continued  nausea or vomiting    Complete by:  As directed    Notify physician for sudden gushing of fluid from the vagina (with or without continued leaking)    Complete by:  As directed    Notify physician for sudden, constant, or occasional abdominal pain    Complete by:  As directed    Notify physician if baby moving less than usual    Complete by:  As directed      Allergies as of 04/24/2017   No Known Allergies     Medication List    TAKE these medications   esomeprazole 20 MG capsule Commonly known as:  NEXIUM Take 1 capsule (20 mg total) by mouth daily at 12 noon.   ferrous sulfate 325 (65 FE) MG tablet Commonly known as:  FERROUSUL Take 1 tablet (325 mg total) by mouth daily with breakfast.   hydroxyprogesterone caproate 250 mg/mL Oil injection Commonly known as:  MAKENA Inject 250 mg into the muscle once a week.   nitrofurantoin (macrocrystal-monohydrate) 100 MG capsule Commonly known as:  MACROBID Take 100 mg by mouth 2 (two) times daily.        Total time spent taking care of this patient: 30 minutes  Signed: Vena Austria 04/24/2017, 11:36 PM

## 2017-04-24 NOTE — Progress Notes (Signed)
Pt reports no problems. Desires cervical check.  

## 2017-04-24 NOTE — Progress Notes (Signed)
21 yo G3 P1101 now 37wk2 days. Prenatal care remarkable for history of second trimester loss with G1, macrosomia with G2, recent positive Chlamydia culture Has completed Makena series. TOC Chlamydia culture and GBS done last week negative EFW last week 7#3oz/ By [facility]opold[facility] today EFW: 8-8.5# Bottle/ BC options discussed today-undecided Cervix 3.5/60%/-1/ vertex Labor precautions

## 2017-04-24 NOTE — Discharge Summary (Signed)
See final progress note. 

## 2017-04-24 NOTE — Discharge Instructions (Signed)
Call provider or return to birthplace with: ° °1. Strong regular contractions every 5 minutes. °2. Leaking of fluid from your vagina °3. Vaginal bleeding: Bright red or heavy like a period °4. Decreased Fetal movement ° °

## 2017-04-25 DIAGNOSIS — O471 False labor at or after 37 completed weeks of gestation: Secondary | ICD-10-CM | POA: Diagnosis not present

## 2017-04-26 ENCOUNTER — Encounter: Payer: Self-pay | Admitting: Obstetrics and Gynecology

## 2017-04-26 ENCOUNTER — Observation Stay
Admission: EM | Admit: 2017-04-26 | Discharge: 2017-04-26 | Disposition: A | Discharge status: Home / Self Care | Payer: 59 | Source: Home / Self Care | Admitting: Obstetrics & Gynecology

## 2017-04-26 DIAGNOSIS — O4693 Antepartum hemorrhage, unspecified, third trimester: Secondary | ICD-10-CM

## 2017-04-26 DIAGNOSIS — A749 Chlamydial infection, unspecified: Secondary | ICD-10-CM

## 2017-04-26 DIAGNOSIS — O0990 Supervision of high risk pregnancy, unspecified, unspecified trimester: Secondary | ICD-10-CM

## 2017-04-26 DIAGNOSIS — Z3490 Encounter for supervision of normal pregnancy, unspecified, unspecified trimester: Secondary | ICD-10-CM

## 2017-04-26 DIAGNOSIS — Z3A37 37 weeks gestation of pregnancy: Secondary | ICD-10-CM

## 2017-04-26 DIAGNOSIS — O98813 Other maternal infectious and parasitic diseases complicating pregnancy, third trimester: Secondary | ICD-10-CM

## 2017-04-26 DIAGNOSIS — O26893 Other specified pregnancy related conditions, third trimester: Secondary | ICD-10-CM | POA: Insufficient documentation

## 2017-04-26 DIAGNOSIS — Z79899 Other long term (current) drug therapy: Secondary | ICD-10-CM | POA: Insufficient documentation

## 2017-04-26 DIAGNOSIS — O09299 Supervision of pregnancy with other poor reproductive or obstetric history, unspecified trimester: Secondary | ICD-10-CM

## 2017-04-26 DIAGNOSIS — N390 Urinary tract infection, site not specified: Secondary | ICD-10-CM

## 2017-04-26 LAB — URINALYSIS, ROUTINE W REFLEX MICROSCOPIC
BILIRUBIN URINE: NEGATIVE
Bacteria, UA: NONE SEEN
GLUCOSE, UA: NEGATIVE mg/dL
KETONES UR: 5 mg/dL — AB
Nitrite: NEGATIVE
PH: 6 (ref 5.0–8.0)
Protein, ur: 30 mg/dL — AB
SPECIFIC GRAVITY, URINE: 1.018 (ref 1.005–1.030)

## 2017-04-26 MED ORDER — NITROFURANTOIN MONOHYD MACRO 100 MG PO CAPS
ORAL_CAPSULE | ORAL | Status: DC
Start: 2017-04-26 — End: 2017-04-27
  Filled 2017-04-26: qty 1

## 2017-04-26 MED ORDER — NITROFURANTOIN MONOHYD MACRO 100 MG PO CAPS
100.0000 mg | ORAL_CAPSULE | Freq: Two times a day (BID) | ORAL | Status: DC
  Administered 2017-04-26: 100 mg via ORAL

## 2017-04-26 MED ORDER — NITROFURANTOIN MONOHYD MACRO 100 MG PO CAPS: 100.0000 mg | ORAL_CAPSULE | Freq: Two times a day (BID) | ORAL | 0 refills | Status: DC

## 2017-04-26 NOTE — OB Triage Note (Signed)
Pt presents to L&D with c/o abdominal cramping when voiding. Pt also states she felt fluid leaking a moderate amount earlier today when walking. She states she is have decreased fetal movement. She denies vaginal bleeding. EFM and toco applied. Will monitor.

## 2017-04-26 NOTE — Final Progress Note (Signed)
Physician Final Progress Note  Patient ID: Linda Choi MRN: [id number] DOB/AGE: 19/12/1995 19 y.o.  Admit date: 04/26/2017 Admitting provider: Nadara Mustardobert P Teola Felipe, MD Discharge date: 04/26/2017  Admission Diagnoses: Pain, Dehydration  Discharge Diagnoses:  Principal Problem:   UTI (urinary tract infection) Active Problems:   Pregnancy  Abd pain in pregnancy, Dehydration, 37 weeks  Consults: None  Significant Findings/ Diagnostic Studies:  HPI:      [name] is a 21 y.o. G3P1101 who LMP was Patient's last menstrual period was 08/08/2016 (approximate)., presents today for a problem visit.    Urinary Tract Infection: Patient complains of frequency, pain in the lower abdomen, suprapubic pressure and urgency . She has had symptoms for several hours. Patient also complains of back pain. Patient denies fever. Patient does have a history of recurrent UTI.  Patient does not have a history of pyelonephritis.   PMHx: She  has a past medical history of Anemia; Cervical incompetence; and Type A blood, Rh negative. Also,  has a past surgical history that includes No past surgeries., family history includes Eclampsia in her mother.,  reports that she has never smoked. She has never used smokeless tobacco. She reports that she does not drink alcohol or use drugs.  She @[facility] @ Also, has No Known Allergies.  Review of Systems  Constitutional: Negative for chills, fever and malaise/fatigue.  HENT: Negative for congestion, sinus pain and sore throat.   Eyes: Negative for blurred vision and pain.  Respiratory: Negative for cough and wheezing.   Cardiovascular: Negative for chest pain and leg swelling.  Gastrointestinal: Negative for abdominal pain, constipation, diarrhea, heartburn, nausea and vomiting.  Genitourinary: Negative for dysuria, frequency, hematuria and urgency.  Musculoskeletal: Negative for back pain, joint pain, myalgias and neck pain.  Skin: Negative for itching  and rash.  Neurological: Negative for dizziness, tremors and weakness.  Endo/Heme/Allergies: Does not bruise/bleed easily.  Psychiatric/Behavioral: Negative for depression. The patient is not nervous/anxious and does not have insomnia.    Objective: BP 118/61   Pulse (!) 108   Temp 98.8 F (37.1 C) (Oral)   LMP 08/08/2016 (Approximate)  Physical Exam  Constitutional: She is oriented to person, place, and time. She appears well-developed and well-nourished. No distress.  Genitourinary: Vagina normal and uterus normal. Pelvic exam was performed with patient supine. There is no rash, tenderness or lesion on the right labia. There is no rash, tenderness or lesion on the left labia. No erythema or bleeding in the vagina. Right adnexum does not display mass and does not display tenderness. Left adnexum does not display mass and does not display tenderness. Cervix does not exhibit motion tenderness, discharge, polyp or nabothian cyst.   Uterus is mobile and midaxial. Uterus is not enlarged or exhibiting a mass.  Genitourinary Comments: Cx 3/60/ballotable  Abdominal: Soft. She exhibits no distension. There is no tenderness.  Musculoskeletal: Normal range of motion.  Neurological: She is alert and oriented to person, place, and time. No cranial nerve deficit.  Skin: Skin is warm and dry.  Psychiatric: She has a normal mood and affect.   Results for orders placed or performed during the hospital encounter of 04/26/17  Urinalysis, Routine w reflex microscopic  Result Value Ref Range   Color, Urine YELLOW (A) YELLOW   APPearance HAZY (A) CLEAR   Specific Gravity, Urine 1.018 1.005 - 1.030   pH 6.0 5.0 - 8.0   Glucose, UA NEGATIVE NEGATIVE mg/dL   Hgb urine dipstick SMALL (A) NEGATIVE  Bilirubin Urine NEGATIVE NEGATIVE   Ketones, ur 5 (A) NEGATIVE mg/dL   Protein, ur 30 (A) NEGATIVE mg/dL   Nitrite NEGATIVE NEGATIVE   Leukocytes, UA SMALL (A) NEGATIVE   RBC / HPF 0-5 0 - 5 RBC/hpf   WBC, UA  TOO NUMEROUS TO COUNT 0 - 5 WBC/hpf   Bacteria, UA NONE SEEN NONE SEEN   Squamous Epithelial / LPF 0-5 (A) NONE SEEN   Mucus PRESENT    Ca Oxalate Crys, UA PRESENT    ASSESSMENT/PLAN:   Acute cystitis - Macrobid- restart.  Fluids for hydration.  Rest Abd Pain of pregnancy - Monitor for s/sx labor.  Counseled as to reasons to return.  Procedures: A NST procedure was performed with FHR monitoring and a normal baseline established, appropriate time of 20-40 minutes of evaluation, and accels >2 seen w 15x15 characteristics.  Results show a REACTIVE NST.   Discharge Condition: good  Disposition: 01-Home or Self Care  Diet: Regular diet  Discharge Activity: Activity as tolerated   Allergies as of 04/26/2017   No Known Allergies     Medication List    STOP taking these medications   hydroxyprogesterone caproate 250 mg/mL Oil injection Commonly known as:  MAKENA     TAKE these medications   esomeprazole 20 MG capsule Commonly known as:  NEXIUM Take 1 capsule (20 mg total) by mouth daily at 12 noon.   ferrous sulfate 325 (65 FE) MG tablet Commonly known as:  FERROUSUL Take 1 tablet (325 mg total) by mouth daily with breakfast.   nitrofurantoin (macrocrystal-monohydrate) 100 MG capsule Commonly known as:  MACROBID Take 1 capsule (100 mg total) by mouth every 12 (twelve) hours. What changed:  when to take this      Follow-up Information    [facility]. Go on 05/01/2017.   Contact information: [address] [place] [address] [phone]          Total time spent taking care of this patient: 15 minutes  Signed: Letitia Libra 04/26/2017, 11:31 PM

## 2017-04-26 NOTE — Discharge Summary (Signed)
  See FPN 

## 2017-04-26 NOTE — Discharge Instructions (Signed)
LABOR: When contractions begin, you should start to time them from the beginning of one contraction to the beginning of the next.  When contractions are 5-10 minutes apart or less and have been regular for at least an hour, you should call your health care provider.  Notify your doctor if any of the following occur: 1. Bleeding from the vagina 7. Sudden, constant, or occasional abdominal pain  2. Pain or burning when urinating 8. Sudden gushing of fluid from the vagina (with or without continued leaking)  3. Chills or fever 9. Fainting spells, "black outs" or loss of consciousness  4. Increase in vaginal discharge 10. Severe or continued nausea or vomiting  5. Pelvic pressure (sudden increase) 11. Blurring of vision or spots before the eyes  6. Baby moving less than usual 12. Leaking of fluid    FETAL KICK COUNT: Lie on your left side for one hour after a meal, and count the number of times your baby kicks. If it is less than 5 times, get up, move around and drink some juice. Repeat the test 30 minutes later. If it is still less than 5 kicks in an hour, notify your doctor.Pregnancy and Urinary Tract Infection What is a urinary tract infection? A urinary tract infection (UTI) is an infection of any part of the urinary tract. This includes the kidneys, the tubes that connect your kidneys to your bladder (ureters), the bladder, and the tube that carries urine out of your body (urethra). These organs make, store, and get rid of urine in the body. A UTI can be a bladder infection (cystitis) or a kidney infection (pyelonephritis). This infection may be caused by fungi, viruses, and bacteria. Bacteria are the most common cause of UTIs. You are more likely to develop a UTI during pregnancy because:  The physical and hormonal changes your body goes through can make it easier for bacteria to get into your urinary tract.  Your growing baby puts pressure on your uterus and can affect urine flow.  Does a UTI  place my baby at risk? An untreated UTI during pregnancy could lead to a kidney infection, which can cause health problems that could affect your baby. Possible complications of an untreated UTI include:  Having your baby before 37 weeks of pregnancy (premature).  Having a baby with a low birth weight.  Developing high blood pressure during pregnancy (preeclampsia).  What are the symptoms of a UTI? Symptoms of a UTI include:  Fever.  Frequent urination or passing small amounts of urine frequently.  Needing to urinate urgently.  Pain or a burning sensation with urination.  Urine that smells bad or unusual.  Cloudy urine.  Pain in the lower abdomen or back.  Trouble urinating.  Blood in the urine.  Vomiting or being less hungry than normal.  Diarrhea or abdominal pain.  Vaginal discharge.  What are the treatment options for a UTI during pregnancy? Treatment for this condition may include:  Antibiotic medicines that are safe to take during pregnancy.  Other medicines to treat less common causes of UTI.  How can I prevent a UTI?  To prevent a UTI:  Go to the bathroom as soon as you feel the need.  Always wipe from front to back.  Wash your genital area with soap and warm water daily.  Empty your bladder before and after sex.  Wear cotton underwear.  Limit your intake of high sugar foods or drinks, such as regular soda, juice, and sweets.  Drink  6-8 glasses of water daily.  Do not wear tight-fitting pants.  Do not douche or use deodorant sprays.  Do not drink alcohol, caffeine, or carbonated drinks. These can irritate the bladder.  Contact a health care provider if:  Your symptoms do not improve or get worse.  You have a fever after two days of treatment.  You have a rash.  You have abnormal vaginal discharge.  You have back or side pain.  You have chills.  You have nausea and vomiting. Get help right away if: Seek immediate medical care  if you are pregnant and:  You feel contractions in your uterus.  You have lower belly pain.  You have a gush of fluid from your vagina.  You have blood in your urine.  You are vomiting and cannot keep down any medicines or water.  This information is not intended to replace advice given to you by your health care provider. Make sure you discuss any questions you have with your health care provider. Document Released: 10/19/2010 Document Revised: 06/07/2016 Document Reviewed: 05/15/2015 Elsevier Interactive Patient Education  2017 [employer].

## 2017-04-28 ENCOUNTER — Inpatient Hospital Stay
Admission: EM | Admit: 2017-04-28 | Discharge: 2017-04-30 | DRG: 805 | Disposition: A | Discharge status: Home / Self Care | Payer: 59 | Attending: Obstetrics and Gynecology | Admitting: Obstetrics and Gynecology

## 2017-04-28 ENCOUNTER — Encounter: Payer: Self-pay | Admitting: *Deleted

## 2017-04-28 DIAGNOSIS — Z6791 Unspecified blood type, Rh negative: Secondary | ICD-10-CM

## 2017-04-28 DIAGNOSIS — O26893 Other specified pregnancy related conditions, third trimester: Secondary | ICD-10-CM | POA: Diagnosis present

## 2017-04-28 DIAGNOSIS — O3433 Maternal care for cervical incompetence, third trimester: Secondary | ICD-10-CM | POA: Diagnosis present

## 2017-04-28 DIAGNOSIS — Z3A37 37 weeks gestation of pregnancy: Secondary | ICD-10-CM

## 2017-04-28 LAB — CHLAMYDIA/NGC RT PCR (ARMC ONLY)
Chlamydia Tr: NOT DETECTED
N gonorrhoeae: NOT DETECTED

## 2017-04-28 LAB — CBC
HEMATOCRIT: 33.4 % — AB (ref 35.0–47.0)
HEMOGLOBIN: 11.2 g/dL — AB (ref 12.0–16.0)
MCH: 26.4 pg (ref 26.0–34.0)
MCHC: 33.6 g/dL (ref 32.0–36.0)
MCV: 78.8 fL — AB (ref 80.0–100.0)
Platelets: 149 10*3/uL — ABNORMAL LOW (ref 150–440)
RBC: 4.24 MIL/uL (ref 3.80–5.20)
RDW: 17.4 % — ABNORMAL HIGH (ref 11.5–14.5)
WBC: 7.4 10*3/uL (ref 3.6–11.0)

## 2017-04-28 MED ORDER — MISOPROSTOL 200 MCG PO TABS
ORAL_TABLET | ORAL | Status: AC
  Filled 2017-04-28: qty 4

## 2017-04-28 MED ORDER — LACTATED RINGERS IV SOLN: 500.0000 mL | INTRAVENOUS | Status: DC | PRN

## 2017-04-28 MED ORDER — OXYTOCIN 40 UNITS IN LACTATED RINGERS INFUSION - SIMPLE MED
1.0000 m[IU]/min | INTRAVENOUS | Status: DC
  Administered 2017-04-28: 2 m[IU]/min via INTRAVENOUS
  Administered 2017-04-29: 666 m[IU]/min via INTRAVENOUS
  Filled 2017-04-28: qty 1000

## 2017-04-28 MED ORDER — ONDANSETRON HCL 4 MG/2ML IJ SOLN: 4.0000 mg | Freq: Four times a day (QID) | INTRAMUSCULAR | Status: DC | PRN

## 2017-04-28 MED ORDER — BUTORPHANOL TARTRATE 2 MG/ML IJ SOLN: 1.0000 mg | INTRAMUSCULAR | Status: DC | PRN

## 2017-04-28 MED ORDER — LACTATED RINGERS IV SOLN
INTRAVENOUS | Status: DC
  Administered 2017-04-28: 21:00:00 via INTRAVENOUS
  Administered 2017-04-28: 1000 mL via INTRAVENOUS

## 2017-04-28 MED ORDER — OXYTOCIN 40 UNITS IN LACTATED RINGERS INFUSION - SIMPLE MED: 2.5000 [IU]/h | INTRAVENOUS | Status: DC

## 2017-04-28 MED ORDER — ACETAMINOPHEN 325 MG PO TABS: 650.0000 mg | ORAL_TABLET | ORAL | Status: DC | PRN

## 2017-04-28 MED ORDER — AMMONIA AROMATIC IN INHA
RESPIRATORY_TRACT | Status: AC
  Filled 2017-04-28: qty 10

## 2017-04-28 MED ORDER — OXYTOCIN 10 UNIT/ML IJ SOLN
INTRAMUSCULAR | Status: AC
  Filled 2017-04-28: qty 2

## 2017-04-28 MED ORDER — LIDOCAINE HCL (PF) 1 % IJ SOLN
INTRAMUSCULAR | Status: AC
  Filled 2017-04-28: qty 30

## 2017-04-28 MED ORDER — OXYTOCIN BOLUS FROM INFUSION: 500.0000 mL | Freq: Once | INTRAVENOUS | Status: DC

## 2017-04-28 MED ORDER — TERBUTALINE SULFATE 1 MG/ML IJ SOLN: 0.2500 mg | Freq: Once | INTRAMUSCULAR | Status: DC | PRN

## 2017-04-28 NOTE — H&P (Signed)
Obstetrics Admission History & Physical   Spontaneous rupture of membranes, contractions.  HPI:  21 y.o. [id number] @ 3441w6d (05/13/2017, by Ultrasound). Admitted on 04/28/2017:   Patient Active Problem List   Diagnosis Date Noted  . Normal labor and delivery 04/28/2017  . UTI (urinary tract infection) 04/26/2017  . Pregnancy 02/13/2017  . History of macrosomia in infant in prior pregnancy, currently pregnant 01/23/2017  . History of pregnancy loss in prior pregnancy, currently pregnant in first trimester 10/03/2016  . High risk pregnancy, antepartum 10/03/2016  . Rh negative state in antepartum period 10/03/2016    Presents for SROM, which awakened her from sleep at 1215 pm. Clear fluid, large amount. Continues to have gushes of clear fluid. Intermittent contractions, states that she "can't really feel them yet."   [facility]. Pregnancy complicated by UTI, Rh negative blood type; chlamydia, positive TOC; history of macrosomia; history of fetal demise.  ROS: A review of systems was performed and negative, except as stated in the above HPI. PMHx:  Past Medical History:  Diagnosis Date  . Anemia   . Cervical incompetence   . Type A blood, Rh negative    PSHx:  Past Surgical History:  Procedure Laterality Date  . NO PAST SURGERIES     Medications:  Prescriptions Prior to Admission  Medication Sig Dispense Refill Last Dose  . esomeprazole (NEXIUM) 20 MG capsule Take 1 capsule (20 mg total) by mouth daily at 12 noon. (Patient not taking: Reported on 04/24/2017) 30 capsule 11 Not Taking at Unknown time  . ferrous sulfate (FERROUSUL) 325 (65 FE) MG tablet Take 1 tablet (325 mg total) by mouth daily with breakfast. (Patient not taking: Reported on 04/17/2017) 30 tablet 3 Not Taking at Unknown time  . nitrofurantoin, macrocrystal-monohydrate, (MACROBID) 100 MG capsule Take 1 capsule (100 mg total) by mouth every 12 (twelve) hours. 10 capsule 0    Allergies: has No Known  Allergies. OBHx:  OB History  Gravida Para Term Preterm AB Living  3 2 1 1   1   SAB TAB Ectopic Multiple Live Births        0 2    # Outcome Date GA Lbr Len/2nd Weight Sex Delivery Anes PTL Lv  3 Current           2 Term 09/15/15 5838w4d / 00:20 9 lb 15 oz (4.508 kg) M Vag-Spont None  LIV  1 Preterm 07/09/14 8525w6d   M Vag-Spont  Y ND     AVW:UJWJXBJY/NWGNFAOZHYQMFHx:Negative/unremarkable except as detailed in HPI. No family history of birth defects. Soc Hx: Never smoker, Alcohol: none and Recreational drug use: none  Objective:   Vitals:   04/28/17 1317  BP: 130/73  Pulse: (!) 139  Resp: 16  Temp: 98.5 F (36.9 C)   Constitutional: Well nourished, well developed female in no acute distress.  HEENT: normal Skin: Warm and dry.  Cardiovascular: Regular rate and rhythm.   Extremity: trace to 1+ bilateral pedal edema Respiratory: Clear to auscultation bilateral. Normal respiratory effort Abdomen: mild Back: no CVAT Neuro: DTRs 2+, Cranial nerves grossly intact Psych: Alert and Oriented x3. No memory deficits. Normal mood and affect.  MS: normal gait, normal bilateral lower extremity ROM/strength/stability.  Pelvic exam: is not limited by body habitus External Genitalia, Bartholin's glands, Urethra, Skene's glands: within normal limits Vagina: within normal limits and with normal mucosa Cervix: not evaluated, 3/60/ballotable on 10/20 triage visit. Will evaluate when contractions become more intense Uterus: Spontaneous uterine activity and  adequate relaxation between contractions.  Adnexa: normal adnexa   Nitrazine positive, fluid clear.  EFM:  FHR: 145 bpm, variability: moderate,  accelerations:  Present,  decelerations:  Absent Toco: Frequency: Every 2-5 minutes, Duration: 60-80 seconds and Intensity: mild   Perinatal info:  Blood type: A negative Rubella - Not immune Varicella - Immune TDaP Given during third trimester of this pregnancy RPR NR / HIV Neg/ HBsAg Neg   Assessment & Plan:    21 y.o. Z6X0960 @ [redacted]w[redacted]d, Admitted on 04/28/2017 for SROM, labor management.    Admit for labor, Observe for cervical change and Fetal Wellbeing Reassuring, GBS negative.   Marcelyn Bruins, CNM [facility], [facility] 04/28/2017  2:56 PM

## 2017-04-28 NOTE — Progress Notes (Signed)
  Labor Progress Note   21 y.o. [id number] @ 4967w6d , admitted for  Pregnancy, Labor Management, SROM.  Subjective:  Upright in bed after a period of amubulation. Mild discomfort with contractions.  Objective:  BP 130/73 (BP Location: Left Arm)   Pulse (!) 139   Temp 98.4 F (36.9 C) (Axillary)   Resp 16   Ht 5\' 4"  (1.626 m)   Wt 191 lb (86.6 kg)   LMP 08/08/2016 (Approximate)   BMI 32.79 kg/m  Abd: mild Extr: trace to 1+ bilateral pedal edema SVE: EXTERNAL GENITALIA: normal appearing vulva with no masses, tenderness or lesions CERVIX: 3 cm dilated, 70% effaced, -3 station, cervical position posterior VAGINA: no abnormalities noted MEMBRANES: ruptured, clear fluid   EFM: FHR: 145 bpm, variability: moderate,  accelerations:  Present,  decelerations:  Absent Toco: Frequency: Every 3-5 minutes, Duration: 60-90 seconds and Intensity: mild Labs: I have reviewed the patient's lab results.   Assessment & Plan:  [id number] @ 6867w6d, admitted for  Pregnancy and Labor/Delivery Management, SROM.  1. Pain management: does not desire medication at this time. Comfort measures: birth ball 2. FWB: FHT category I.  3. ID: GBS negative 4. Labor management: continue to monitor for active labor, ambulation and upright positioning encouraged.  All discussed with patient, see orders.  Marcelyn BruinsJacelyn Schmid, CNM 04/28/2017  4:15 PM

## 2017-04-28 NOTE — Progress Notes (Signed)
  Labor Progress Note   21 y.o. Z6X0960G3P1101 @ 5521w6d , admitted for Pregnancy, Labor Management, SROM  Subjective:  Sitting on birth ball, not feeling contractions now.  Objective:  BP 105/66 (BP Location: Left Arm)   Pulse 100   Temp 98.2 F (36.8 C) (Oral)   Resp 16   Ht 5\' 4"  (1.626 m)   Wt 191 lb (86.6 kg)   LMP 08/08/2016 (Approximate)   BMI 32.79 kg/m  Abd: nontender Extr: trace to 1+ bilateral pedal edema SVE: no change  EFM: FHR: 145 bpm, variability: moderate,  accelerations:  Present,  decelerations:  Absent Toco: irregular contractions Labs: I have reviewed the patient's lab results.   Assessment & Plan:  A5W0981G3P1101 @ 7121w6d, admitted for  Pregnancy and Labor/Delivery Management, SROM.  1. Pain management: Stadol as needed when contractions become established and painful 2. FWB: FHT category I.  3. ID: GBS negative 4. Labor management: Augment with Pitocin, as contractions have become irregular and patient is not progressing to active labor with PROM. All discussed with patient, see orders.  Marcelyn BruinsJacelyn Schmid, CNM 04/28/2017  7:43 PM

## 2017-04-29 LAB — RPR: RPR: NONREACTIVE

## 2017-04-29 MED ORDER — ONDANSETRON HCL 4 MG/2ML IJ SOLN: 4.0000 mg | INTRAMUSCULAR | Status: DC | PRN

## 2017-04-29 MED ORDER — ONDANSETRON HCL 4 MG PO TABS: 4.0000 mg | ORAL_TABLET | ORAL | Status: DC | PRN

## 2017-04-29 MED ORDER — SENNOSIDES-DOCUSATE SODIUM 8.6-50 MG PO TABS
2.0000 | ORAL_TABLET | ORAL | Status: DC
  Administered 2017-04-29: 2 via ORAL
  Filled 2017-04-29: qty 2

## 2017-04-29 MED ORDER — BENZOCAINE-MENTHOL 20-0.5 % EX AERO: 1.0000 "application " | INHALATION_SPRAY | CUTANEOUS | Status: DC | PRN

## 2017-04-29 MED ORDER — ACETAMINOPHEN 325 MG PO TABS
650.0000 mg | ORAL_TABLET | ORAL | Status: DC | PRN
  Administered 2017-04-29 (×3): 650 mg via ORAL
  Filled 2017-04-29 (×3): qty 2

## 2017-04-29 MED ORDER — SIMETHICONE 80 MG PO CHEW: 80.0000 mg | CHEWABLE_TABLET | ORAL | Status: DC | PRN

## 2017-04-29 MED ORDER — COCONUT OIL OIL: 1.0000 "application " | TOPICAL_OIL | Status: DC | PRN

## 2017-04-29 MED ORDER — FENTANYL CITRATE (PF) 100 MCG/2ML IJ SOLN
50.0000 ug | INTRAMUSCULAR | Status: AC
  Administered 2017-04-29: 50 ug via INTRAVENOUS
  Filled 2017-04-29: qty 2

## 2017-04-29 MED ORDER — OXYCODONE-ACETAMINOPHEN 5-325 MG PO TABS: 2.0000 | ORAL_TABLET | ORAL | Status: DC | PRN

## 2017-04-29 MED ORDER — IBUPROFEN 600 MG PO TABS
600.0000 mg | ORAL_TABLET | Freq: Four times a day (QID) | ORAL | Status: DC
  Administered 2017-04-29 – 2017-04-30 (×5): 600 mg via ORAL
  Filled 2017-04-29 (×5): qty 1

## 2017-04-29 MED ORDER — DIPHENHYDRAMINE HCL 25 MG PO CAPS: 25.0000 mg | ORAL_CAPSULE | Freq: Four times a day (QID) | ORAL | Status: DC | PRN

## 2017-04-29 MED ORDER — OXYCODONE-ACETAMINOPHEN 5-325 MG PO TABS: 1.0000 | ORAL_TABLET | ORAL | Status: DC | PRN

## 2017-04-29 MED ORDER — PRENATAL MULTIVITAMIN CH
1.0000 | ORAL_TABLET | Freq: Every day | ORAL | Status: DC
  Administered 2017-04-29 – 2017-04-30 (×2): 1 via ORAL
  Filled 2017-04-29 (×2): qty 1

## 2017-04-29 MED ORDER — WITCH HAZEL-GLYCERIN EX PADS: 1.0000 "application " | MEDICATED_PAD | CUTANEOUS | Status: DC | PRN

## 2017-04-29 MED ORDER — DIBUCAINE 1 % RE OINT: 1.0000 "application " | TOPICAL_OINTMENT | RECTAL | Status: DC | PRN

## 2017-04-29 MED ORDER — MEASLES, MUMPS & RUBELLA VAC ~~LOC~~ INJ
0.5000 mL | INJECTION | Freq: Once | SUBCUTANEOUS | Status: AC
  Administered 2017-04-30: 0.5 mL via SUBCUTANEOUS
  Filled 2017-04-29 (×2): qty 0.5

## 2017-04-29 NOTE — Discharge Summary (Signed)
OB Discharge Summary     Patient Name: Linda Choi DOB: 05/24/1996 MRN: [id number]  Date of admission: 04/28/2017 Delivering MD: Vena AustriaAndreas Charm Stenner, MD  Date of Delivery: 04/28/2017  Date of discharge:06/30/17  Admitting diagnosis: 37 wks, water broke Intrauterine pregnancy: 7067w0d     Secondary diagnosis: None     Discharge diagnosis: Term Pregnancy Delivered, No other diagnosis                         Hospital course:  Onset of Labor With Vaginal Delivery     21 y.o. yo G3P1101 at 5367w0d was admitted in Latent Labor on 04/28/2017. Patient had an uncomplicated labor course as follows:  Membrane Rupture Time/Date: 12:15 PM ,04/28/2017   Intrapartum Procedures: Episiotomy: None [1]                                         Lacerations:  None [1]  Patient had a delivery of a Viable infant. 04/29/2017  Information for the patient's newborn:  Jonathon ResidesBesaw, Girl Urijah [[id number]][[id number]]  Delivery Method: Vag-Spont    Pateint had an uncomplicated postpartum course.  She is ambulating, tolerating a regular diet, passing flatus, and urinating well. Patient is discharged home in stable condition on 04/30/17.                                                                  Post partum procedures:none  Complications: None  Physical exam on 04/30/2017: Vitals:   04/29/17 1920 04/29/17 2307 04/30/17 0433 04/30/17 0749  BP: 125/73 129/73 115/60 121/66  Pulse: 94 81 (!) 102 70  Resp: 18 18  18   Temp: 98.2 F (36.8 C) 97.9 F (36.6 C) 98.6 F (37 C) 98.6 F (37 C)  TempSrc: Oral Oral Oral Oral  SpO2: 100% 100% 100% 98%  Weight:      Height:       General: alert, cooperative and no distress Lochia: appropriate Uterine Fundus: firm DVT Evaluation: No evidence of DVT seen on physical exam.  Labs: Lab Results  Component Value Date   WBC 9.4 04/30/2017   HGB 10.5 (L) 04/30/2017   HCT 31.6 (L) 04/30/2017   MCV 79.2 (L) 04/30/2017   PLT 120 (L) 04/30/2017   CMP Latest Ref Rng & Units  01/07/2017  Glucose 65 - 99 mg/dL 82  BUN 6 - 20 mg/dL 11  Creatinine 0.860.44 - 5.781.00 mg/dL 4.690.48  Sodium 629135 - 528145 mmol/L 136  Potassium 3.5 - 5.1 mmol/L 4.0  Chloride 101 - 111 mmol/L 107  CO2 22 - 32 mmol/L 23  Calcium 8.9 - 10.3 mg/dL 8.9   Information for the patient's newborn:  Theresia LoBesaw, Girl Mirtha [[id number]][[id number]]  O NEG   Discharge instruction: per After Visit Summary.  Medications:  Allergies as of 04/30/2017   No Known Allergies     Medication List    STOP taking these medications   nitrofurantoin (macrocrystal-monohydrate) 100 MG capsule Commonly known as:  MACROBID     TAKE these medications   esomeprazole 20 MG capsule Commonly known as:  NEXIUM Take 1 capsule (20 mg total) by mouth daily at 12 noon.  ferrous sulfate 325 (65 FE) MG tablet Commonly known as:  FERROUSUL Take 1 tablet (325 mg total) by mouth daily with breakfast.   ibuprofen 600 MG tablet Commonly known as:  ADVIL,MOTRIN Take 1 tablet (600 mg total) by mouth every 6 (six) hours.       Diet: routine diet  Activity: Advance as tolerated. Pelvic rest for 6 weeks.   Outpatient follow up: Follow-up Information    Oswaldo Conroy, CNM. Schedule an appointment as soon as possible for a visit in 6 week(s).   Specialty:  Certified Nurse Midwife Contact information: [address] [place] [address] [phone]             Postpartum contraception: Vasectomy Rhogam Given postpartum: no Rubella vaccine given postpartum: yes Varicella vaccine given postpartum: no TDaP given antepartum or postpartum: Yes  Newborn Data: Live born female  Birth Weight: 9 lb 2.7 oz (4160 g) APGAR: 6, 9  Newborn Delivery   Birth date/time:  04/29/2017 04:20:00 Delivery type:  Vaginal, Spontaneous Delivery       Baby Feeding: Bottle  Disposition:home with mother  SIGNED: Vena Austria, MD

## 2017-04-30 LAB — TYPE AND SCREEN
ABO/RH(D): A NEG
Antibody Screen: POSITIVE
UNIT DIVISION: 0
Unit division: 0

## 2017-04-30 LAB — CBC
HCT: 31.6 % — ABNORMAL LOW (ref 35.0–47.0)
HEMOGLOBIN: 10.5 g/dL — AB (ref 12.0–16.0)
MCH: 26.3 pg (ref 26.0–34.0)
MCHC: 33.2 g/dL (ref 32.0–36.0)
MCV: 79.2 fL — ABNORMAL LOW (ref 80.0–100.0)
PLATELETS: 120 10*3/uL — AB (ref 150–440)
RBC: 3.99 MIL/uL (ref 3.80–5.20)
RDW: 17.5 % — ABNORMAL HIGH (ref 11.5–14.5)
WBC: 9.4 10*3/uL (ref 3.6–11.0)

## 2017-04-30 LAB — BPAM RBC
Blood Product Expiration Date: 201811012359
Blood Product Expiration Date: 201811122359
UNIT TYPE AND RH: 600
Unit Type and Rh: 600

## 2017-04-30 MED ORDER — IBUPROFEN 600 MG PO TABS: 600.0000 mg | ORAL_TABLET | Freq: Four times a day (QID) | ORAL | 0 refills | Status: DC

## 2017-05-01 ENCOUNTER — Encounter: Payer: Medicaid Other | Admitting: Maternal Newborn

## 2017-06-11 ENCOUNTER — Ambulatory Visit (INDEPENDENT_AMBULATORY_CARE_PROVIDER_SITE_OTHER): Payer: Medicaid Other | Admitting: Obstetrics and Gynecology

## 2017-06-11 ENCOUNTER — Ambulatory Visit (INDEPENDENT_AMBULATORY_CARE_PROVIDER_SITE_OTHER): Payer: Medicaid Other

## 2017-06-11 ENCOUNTER — Encounter: Payer: Self-pay | Admitting: Obstetrics and Gynecology

## 2017-06-11 DIAGNOSIS — Z3042 Encounter for surveillance of injectable contraceptive: Secondary | ICD-10-CM | POA: Diagnosis not present

## 2017-06-11 DIAGNOSIS — Z113 Encounter for screening for infections with a predominantly sexual mode of transmission: Secondary | ICD-10-CM

## 2017-06-11 DIAGNOSIS — Z30013 Encounter for initial prescription of injectable contraceptive: Secondary | ICD-10-CM

## 2017-06-11 LAB — POCT URINE PREGNANCY: PREG TEST UR: NEGATIVE

## 2017-06-11 MED ORDER — MEDROXYPROGESTERONE ACETATE 150 MG/ML IM SUSP
150.0000 mg | Freq: Once | INTRAMUSCULAR | Status: AC
  Administered 2017-06-11: 150 mg via INTRAMUSCULAR

## 2017-06-11 MED ORDER — MEDROXYPROGESTERONE ACETATE 150 MG/ML IM SUSP: 150.0000 mg | INTRAMUSCULAR | 3 refills | Status: DC

## 2017-06-11 NOTE — Progress Notes (Addendum)
Postpartum Visit   Chief Complaint  Patient presents with  . 6 week post partum   History of Present Illness: Patient is a 21 y.o. [id number] presents for postpartum visit.  Date of delivery: 04/29/2017 Type of delivery: Vaginal delivery - Vacuum or forceps assisted  no Episiotomy No.  Laceration: no Pregnancy or labor problems:  no Any problems since the delivery:  no  Newborn Details:  SINGLETON :  1. Baby's name: Darryl Lentria Rose. Birth weight: 9.2 Maternal Details:  Breast Feeding:  no Post partum depression/anxiety noted:  no Edinburgh Post-Partum Depression Score:  3  Date of last PAP: n/a due to age  Past Medical History:  Diagnosis Date  . Anemia   . Cervical incompetence   . Type A blood, Rh negative     Past Surgical History:  Procedure Laterality Date  . NO PAST SURGERIES      Prior to Admission medications   Medication Sig Start Date End Date Taking? Authorizing Provider  ibuprofen (ADVIL,MOTRIN) 600 MG tablet Take 1 tablet (600 mg total) by mouth every 6 (six) hours. 04/30/17   Vena AustriaStaebler, Andreas, MD   Allergies: No Known Allergies   Social History   Socioeconomic History  . Marital status: Single    Spouse name: Not on file  . Number of children: 2  . Years of education: GED  . Highest education level: Not on file  Social Needs  . Financial resource strain: Not on file  . Food insecurity - worry: Not on file  . Food insecurity - inability: Not on file  . Transportation needs - medical: Not on file  . Transportation needs - non-medical: Not on file  Occupational History    Employer: [employer]  Tobacco Use  . Smoking status: Never Smoker  . Smokeless tobacco: Never Used  Substance and Sexual Activity  . Alcohol use: No  . Drug use: No  . Sexual activity: Yes    Partners: Male    Birth control/protection: None  Other Topics Concern  . Not on file  Social History Narrative   Lives with boyfriend and dad in a 2 story home.  Has 1 son and is  pregnant with a daughter.  Due in November 2018.  Works at [employer].  Education: GED.     Family History  Problem Relation Age of Onset  . Eclampsia Mother   . Breast cancer Neg Hx   . Ovarian cancer Neg Hx     Review of Systems  Constitutional: Negative.   HENT: Negative.   Eyes: Negative.   Respiratory: Negative.   Cardiovascular: Negative.   Gastrointestinal: Negative.   Genitourinary: Negative.   Musculoskeletal: Negative.   Skin: Negative.   Neurological: Negative.   Psychiatric/Behavioral: Negative.      Physical Exam BP 118/74   Ht 5\' 4"  (1.626 m)   Wt 172 lb (78 kg)   LMP 06/04/2017   BMI 29.52 kg/m   Physical Exam  Constitutional: She is oriented to person, place, and time. She appears well-developed and well-nourished. No distress.  Eyes: EOM are normal. No scleral icterus.  Neck: Normal range of motion. Neck supple.  Cardiovascular: Normal rate and regular rhythm.  Pulmonary/Chest: Effort normal and breath sounds normal. No respiratory distress. She has no wheezes. She has no rales.  Abdominal: Soft. Bowel sounds are normal. She exhibits no distension and no mass. There is no tenderness. There is no rebound and no guarding.  Musculoskeletal: Normal range of motion. She exhibits no  edema.  Neurological: She is alert and oriented to person, place, and time. No cranial nerve deficit.  Skin: Skin is warm and dry. No erythema.  Psychiatric: She has a normal mood and affect. Her behavior is normal. Judgment normal.     Female Chaperone present during breast and/or pelvic exam.  Assessment: 21 y.o. [id number] presenting for 6 week postpartum visit  Plan: Problem List Items Addressed This Visit    None    Visit Diagnoses    Postpartum care and examination    -  Primary   Relevant Orders   GC/Chlamydia Probe Amp   Encounter for initial prescription of injectable contraceptive       Relevant Medications   medroxyPROGESTERone (DEPO-PROVERA) 150 MG/ML  injection   Screen for STD (sexually transmitted disease)       Relevant Orders   GC/Chlamydia Probe Amp     1) Contraception Education given regarding options for contraception, including injectable contraception.  2)  Pap - ASCCP guidelines and rational discussed.  Patient opts for routine screening interval. N/A given age  213) Patient underwent screening for postpartum depression with no concerns noted.  4) Follow up 1 year for routine annual exam  Thomasene MohairStephen Jackson, MD 06/11/2017 6:56 PM

## 2017-06-11 NOTE — Addendum Note (Signed)
Addended by: Thomasene MohairJACKSON, STEPHEN D on: 06/11/2017 06:56 PM   Modules accepted: Orders

## 2017-06-17 LAB — GC/CHLAMYDIA PROBE AMP
CHLAMYDIA, DNA PROBE: NEGATIVE
Neisseria gonorrhoeae by PCR: NEGATIVE

## 2017-09-03 ENCOUNTER — Ambulatory Visit: Payer: Medicaid Other

## 2017-09-04 ENCOUNTER — Ambulatory Visit (INDEPENDENT_AMBULATORY_CARE_PROVIDER_SITE_OTHER): Payer: Self-pay

## 2017-09-04 DIAGNOSIS — Z3042 Encounter for surveillance of injectable contraceptive: Secondary | ICD-10-CM

## 2017-09-04 MED ORDER — MEDROXYPROGESTERONE ACETATE 150 MG/ML IM SUSP
150.0000 mg | Freq: Once | INTRAMUSCULAR | Status: AC
  Administered 2017-09-04: 150 mg via INTRAMUSCULAR

## 2017-10-06 ENCOUNTER — Ambulatory Visit: Payer: Self-pay | Admitting: Obstetrics and Gynecology

## 2017-11-05 ENCOUNTER — Ambulatory Visit (INDEPENDENT_AMBULATORY_CARE_PROVIDER_SITE_OTHER): Payer: Self-pay | Admitting: Obstetrics and Gynecology

## 2017-11-05 ENCOUNTER — Encounter: Payer: Self-pay | Admitting: Obstetrics and Gynecology

## 2017-11-05 VITALS — BP 110/68 | HR 90 | Ht 64.0 in | Wt 181.0 lb

## 2017-11-05 DIAGNOSIS — Z113 Encounter for screening for infections with a predominantly sexual mode of transmission: Secondary | ICD-10-CM

## 2017-11-05 DIAGNOSIS — Z3042 Encounter for surveillance of injectable contraceptive: Secondary | ICD-10-CM

## 2017-11-05 DIAGNOSIS — N930 Postcoital and contact bleeding: Secondary | ICD-10-CM

## 2017-11-05 NOTE — Progress Notes (Signed)
Patient, No Pcp Per   Chief Complaint  Patient presents with  . Exposure to STD    bleeding after intercourse    HPI:      [name] is a 22 y.o. [id number] who LMP was No LMP recorded. Patient has had an injection., presents today for bleeding after sex recently. She has light pink bleeding that starts after sex is over and then turns darks. Lasts a couple days and then resolves. Sx recur again after next sex encounter. No vag sx, no pelvic pain. Hx of chlamydia in the past and had neg TOC. Pt started depo 12/18. No bleeding, spotting with depo, except for recent sx. Has had a little cramping, no constipation/GI sx.    Past Medical History:  Diagnosis Date  . Anemia   . Cervical incompetence   . Type A blood, Rh negative     Past Surgical History:  Procedure Laterality Date  . NO PAST SURGERIES      Family History  Problem Relation Age of Onset  . Eclampsia Mother   . Breast cancer Neg Hx   . Ovarian cancer Neg Hx     Social History   Socioeconomic History  . Marital status: Single    Spouse name: Not on file  . Number of children: 2  . Years of education: GED  . Highest education level: Not on file  Occupational History    Employer: [employer]  Social Needs  . Financial resource strain: Not on file  . Food insecurity:    Worry: Not on file    Inability: Not on file  . Transportation needs:    Medical: Not on file    Non-medical: Not on file  Tobacco Use  . Smoking status: Never Smoker  . Smokeless tobacco: Never Used  Substance and Sexual Activity  . Alcohol use: No  . Drug use: No  . Sexual activity: Yes    Partners: Male    Birth control/protection: Injection  Lifestyle  . Physical activity:    Days per week: Not on file    Minutes per session: Not on file  . Stress: Not on file  Relationships  . Social connections:    Talks on phone: Not on file    Gets together: Not on file    Attends religious service: Not on file   Active member of club or organization: Not on file    Attends meetings of clubs or organizations: Not on file    Relationship status: Not on file  . Intimate partner violence:    Fear of current or ex partner: Not on file    Emotionally abused: Not on file    Physically abused: Not on file    Forced sexual activity: Not on file  Other Topics Concern  . Not on file  Social History Narrative   Lives with boyfriend and dad in a 2 story home.Works at [employer].  Education: GED.     Outpatient Medications Prior to Visit  Medication Sig Dispense Refill  . ibuprofen (ADVIL,MOTRIN) 600 MG tablet Take 1 tablet (600 mg total) by mouth every 6 (six) hours. 30 tablet 0  . medroxyPROGESTERone (DEPO-PROVERA) 150 MG/ML injection Inject 1 mL (150 mg total) into the muscle every 3 (three) months. 1 mL 3   No facility-administered medications prior to visit.     ROS:  Review of Systems  Constitutional: Negative for fever.  Gastrointestinal: Negative for blood in stool, constipation, diarrhea,  nausea and vomiting.  Genitourinary: Positive for vaginal bleeding. Negative for dyspareunia, dysuria, flank pain, frequency, hematuria, urgency, vaginal discharge and vaginal pain.  Musculoskeletal: Negative for back pain.  Skin: Negative for rash.    OBJECTIVE:   Vitals:  BP 110/68   Pulse 90   Ht  (1.626 m)   Wt 181 lb (82.1 kg)   Breastfeeding? No   BMI 31.07 kg/m   Physical Exam  Constitutional: She is oriented to person, place, and time. Vital signs are normal. She appears well-developed.  Pulmonary/Chest: Effort normal.  Genitourinary: Vagina normal and uterus normal. There is no rash, tenderness or lesion on the right labia. There is no rash, tenderness or lesion on the left labia. Uterus is not enlarged and not tender. Cervix exhibits no motion tenderness. Right adnexum displays no mass and no tenderness. Left adnexum displays no mass and no tenderness. No erythema or tenderness in  the vagina. No vaginal discharge found.  Musculoskeletal: Normal range of motion.  Neurological: She is alert and oriented to person, place, and time.  Psychiatric: She has a normal mood and affect. Her behavior is normal. Thought content normal.  Vitals reviewed.   Assessment/Plan: Postcoital bleeding - Check gon/chlam. If neg, most likely due to endometrial instability with recent depo start. Will call wiht results.  - Plan: Other/Misc lab test  Screening for STD (sexually transmitted disease) - Plan: Other/Misc lab test  Encounter for surveillance of injectable contraceptive - Doing well so far. Cont Rx.    Return if symptoms worsen or fail to improve.  Alicia B. Copland, PA-C 11/05/2017 9:40 AM

## 2017-11-05 NOTE — Patient Instructions (Signed)
I value your feedback and entrusting us with your care. If you get a [facility] patient survey, I would appreciate you taking the time to let us know about your experience today. Thank you! 

## 2017-11-10 ENCOUNTER — Telehealth: Payer: Self-pay | Admitting: Obstetrics and Gynecology

## 2017-11-10 NOTE — Telephone Encounter (Signed)
LM with neg STD results. See if postcoital bleeding sx improve with continued depo use. F/u prn.

## 2017-11-27 ENCOUNTER — Ambulatory Visit: Payer: Self-pay

## 2018-09-09 ENCOUNTER — Other Ambulatory Visit: Payer: Self-pay

## 2018-09-09 ENCOUNTER — Emergency Department (HOSPITAL_COMMUNITY)
Admission: EM | Admit: 2018-09-09 | Discharge: 2018-09-09 | Disposition: A | Discharge status: Home / Self Care | Payer: Self-pay | Attending: Emergency Medicine | Admitting: Emergency Medicine

## 2018-09-09 ENCOUNTER — Emergency Department (HOSPITAL_COMMUNITY): Payer: Self-pay

## 2018-09-09 DIAGNOSIS — N83299 Other ovarian cyst, unspecified side: Secondary | ICD-10-CM

## 2018-09-09 DIAGNOSIS — Z79899 Other long term (current) drug therapy: Secondary | ICD-10-CM | POA: Insufficient documentation

## 2018-09-09 DIAGNOSIS — N83292 Other ovarian cyst, left side: Secondary | ICD-10-CM | POA: Insufficient documentation

## 2018-09-09 LAB — URINALYSIS, ROUTINE W REFLEX MICROSCOPIC
Bilirubin Urine: NEGATIVE
Glucose, UA: NEGATIVE mg/dL
Hgb urine dipstick: NEGATIVE
Ketones, ur: NEGATIVE mg/dL
Nitrite: NEGATIVE
Protein, ur: NEGATIVE mg/dL
Specific Gravity, Urine: 1.026 (ref 1.005–1.030)
pH: 6 (ref 5.0–8.0)

## 2018-09-09 LAB — WET PREP, GENITAL
Sperm: NONE SEEN
Trich, Wet Prep: NONE SEEN
Yeast Wet Prep HPF POC: NONE SEEN

## 2018-09-09 LAB — CBC WITH DIFFERENTIAL/PLATELET
Abs Immature Granulocytes: 0.04 10*3/uL (ref 0.00–0.07)
Basophils Absolute: 0 10*3/uL (ref 0.0–0.1)
Basophils Relative: 0 %
EOS ABS: 0 10*3/uL (ref 0.0–0.5)
Eosinophils Relative: 0 %
HCT: 37.3 % (ref 36.0–46.0)
Hemoglobin: 12.1 g/dL (ref 12.0–15.0)
Immature Granulocytes: 0 %
Lymphocytes Relative: 22 %
Lymphs Abs: 2.3 10*3/uL (ref 0.7–4.0)
MCH: 27.3 pg (ref 26.0–34.0)
MCHC: 32.4 g/dL (ref 30.0–36.0)
MCV: 84.2 fL (ref 80.0–100.0)
Monocytes Absolute: 0.5 10*3/uL (ref 0.1–1.0)
Monocytes Relative: 5 %
Neutro Abs: 7.5 10*3/uL (ref 1.7–7.7)
Neutrophils Relative %: 73 %
PLATELETS: 180 10*3/uL (ref 150–400)
RBC: 4.43 MIL/uL (ref 3.87–5.11)
RDW: 12.3 % (ref 11.5–15.5)
WBC: 10.4 10*3/uL (ref 4.0–10.5)
nRBC: 0 % (ref 0.0–0.2)

## 2018-09-09 LAB — COMPREHENSIVE METABOLIC PANEL
ALK PHOS: 95 U/L (ref 38–126)
ALT: 19 U/L (ref 0–44)
AST: 15 U/L (ref 15–41)
Albumin: 3.7 g/dL (ref 3.5–5.0)
Anion gap: 7 (ref 5–15)
BUN: 10 mg/dL (ref 6–20)
CALCIUM: 9 mg/dL (ref 8.9–10.3)
CO2: 21 mmol/L — ABNORMAL LOW (ref 22–32)
Chloride: 109 mmol/L (ref 98–111)
Creatinine, Ser: 0.57 mg/dL (ref 0.44–1.00)
GFR calc Af Amer: >60 mL/min (ref >60)
Glucose, Bld: 89 mg/dL (ref 70–99)
Potassium: 4 mmol/L (ref 3.5–5.1)
Sodium: 137 mmol/L (ref 135–145)
Total Bilirubin: 0.6 mg/dL (ref 0.3–1.2)
Total Protein: 6.6 g/dL (ref 6.5–8.1)

## 2018-09-09 LAB — PREGNANCY, URINE: Preg Test, Ur: NEGATIVE

## 2018-09-09 MED ORDER — HYDROCODONE-ACETAMINOPHEN 5-325 MG PO TABS: 1.0000 | ORAL_TABLET | Freq: Four times a day (QID) | ORAL | 0 refills | Status: DC | PRN

## 2018-09-09 MED ORDER — KETOROLAC TROMETHAMINE 60 MG/2ML IM SOLN
60.0000 mg | Freq: Once | INTRAMUSCULAR | Status: AC
  Administered 2018-09-09: 60 mg via INTRAMUSCULAR
  Filled 2018-09-09: qty 2

## 2018-09-09 MED ORDER — IOHEXOL 300 MG/ML  SOLN
100.0000 mL | Freq: Once | INTRAMUSCULAR | Status: AC | PRN
  Administered 2018-09-09: 100 mL via INTRAVENOUS

## 2018-09-09 MED ORDER — IBUPROFEN 800 MG PO TABS: 800.0000 mg | ORAL_TABLET | Freq: Three times a day (TID) | ORAL | 0 refills | Status: DC

## 2018-09-09 NOTE — ED Notes (Signed)
Declined W/C at D/C and was escorted to lobby by RN. 

## 2018-09-09 NOTE — ED Provider Notes (Signed)
[facility] Provider Note   CSN: [id number] Arrival date & time: 09/09/18  1213    History   Chief Complaint Chief Complaint  Patient presents with  . Abdominal Pain    HPI Linda Choi is a 23 y.o. female.     HPI Patient had fairly acute onset of lower abdominal pain this morning.  She reports that she suddenly had quite a bit of pain in the left lower quadrant but then it seemed to actually moved to the right.  She vomited once because it was so painful.  As she has not had similar episodes in the past.  She denies she has been having any vaginal discharge or bleeding.  Patient was sexually active yesterday evening without pain.  She is not on birth control.  She has missed a menstrual cycle.  She has not had fever, chills, pain burning urgency with urination.  No associated back or flank pain.  Patient was somewhat concerned because her significant other had appendicitis in a similar fashion with a pretty quick onset of severe pain. Past Medical History:  Diagnosis Date  . Anemia   . Cervical incompetence   . Type A blood, Rh negative     Patient Active Problem List   Diagnosis Date Noted  . Normal labor and delivery 04/28/2017  . UTI (urinary tract infection) 04/26/2017  . Pregnancy 02/13/2017  . History of macrosomia in infant in prior pregnancy, currently pregnant 01/23/2017  . History of pregnancy loss in prior pregnancy, currently pregnant in first trimester 10/03/2016  . High risk pregnancy, antepartum 10/03/2016  . Rh negative state in antepartum period 10/03/2016    Past Surgical History:  Procedure Laterality Date  . NO PAST SURGERIES       OB History    Gravida  3   Para  3   Term  2   Preterm  1   AB      Living  2     SAB      TAB      Ectopic      Multiple  0   Live Births  3            Home Medications    Prior to Admission medications   Medication Sig Start Date End Date  Taking? Authorizing Provider  HYDROcodone-acetaminophen (NORCO/VICODIN) 5-325 MG tablet Take 1-2 tablets by mouth every 6 (six) hours as needed for moderate pain or severe pain. 09/09/18   Arby BarrettePfeiffer, Shamiah Kahler, MD  ibuprofen (ADVIL,MOTRIN) 600 MG tablet Take 1 tablet (600 mg total) by mouth every 6 (six) hours. 04/30/17   Vena AustriaStaebler, Andreas, MD  ibuprofen (ADVIL,MOTRIN) 800 MG tablet Take 1 tablet (800 mg total) by mouth 3 (three) times daily. 09/09/18   Arby BarrettePfeiffer, Zurii Hewes, MD  medroxyPROGESTERone (DEPO-PROVERA) 150 MG/ML injection Inject 1 mL (150 mg total) into the muscle every 3 (three) months. 06/11/17   Conard NovakJackson, Stephen D, MD    Family History Family History  Problem Relation Age of Onset  . Eclampsia Mother   . Breast cancer Neg Hx   . Ovarian cancer Neg Hx     Social History Social History   Tobacco Use  . Smoking status: Never Smoker  . Smokeless tobacco: Never Used  Substance Use Topics  . Alcohol use: No  . Drug use: No     Allergies   Patient has no known allergies.   Review of Systems Review of Systems 10 Systems reviewed and  are negative for acute change except as noted in the HPI.   Physical Exam Updated Vital Signs BP (!) 101/55 (BP Location: Right Arm)   Pulse 84   Temp 98.8 F (37.1 C) (Oral)   Resp 16   Ht 5\' 5"  (1.651 m)   Wt 79.4 kg   LMP 07/11/2018 (Approximate)   SpO2 98%   Breastfeeding No   BMI 29.12 kg/m   Physical Exam Constitutional:      Appearance: Normal appearance.  HENT:     Head: Normocephalic and atraumatic.  Eyes:     Extraocular Movements: Extraocular movements intact.  Cardiovascular:     Rate and Rhythm: Normal rate and regular rhythm.  Pulmonary:     Effort: Pulmonary effort is normal.     Breath sounds: Normal breath sounds.  Abdominal:     Comments: Abdomen is soft.  Moderate suprapubic tenderness and deep right lower quadrant tenderness to palpation.  Upper abdomen nontender.  No CVA tenderness.  Genitourinary:    Comments:  Normal external female genitalia.  Speculum exam scant amount of whitish discharge in the vault.  No blood from the cervix.  No cervical motion tenderness.  Left adnexa nontender.  Uterus moderately tender to palpation and movement.  Right adnexa tender to palpation. Musculoskeletal: Normal range of motion.        General: No swelling or tenderness.  Skin:    General: Skin is warm and dry.  Neurological:     General: No focal deficit present.     Mental Status: She is alert and oriented to person, place, and time.     Coordination: Coordination normal.     Comments: Patient is up and ambulatory about the emergency department with coordinated gait.  Psychiatric:        Mood and Affect: Mood normal.      ED Treatments / Results  Labs (all labs ordered are listed, but only abnormal results are displayed) Labs Reviewed  WET PREP, GENITAL - Abnormal; Notable for the following components:      Result Value   Clue Cells Wet Prep HPF POC PRESENT (*)    WBC, Wet Prep HPF POC MANY (*)    All other components within normal limits  URINALYSIS, ROUTINE W REFLEX MICROSCOPIC - Abnormal; Notable for the following components:   Leukocytes,Ua SMALL (*)    Bacteria, UA RARE (*)    All other components within normal limits  COMPREHENSIVE METABOLIC PANEL - Abnormal; Notable for the following components:   CO2 21 (*)    All other components within normal limits  URINE CULTURE  PREGNANCY, URINE  CBC WITH DIFFERENTIAL/PLATELET  GC/CHLAMYDIA PROBE AMP ([place]) NOT AT [facility]    EKG None  Radiology US Transvaginal Non-ob  Result Date: 09/09/2018 CLINICAL DATA:  RIGHT > LEFT pelvic pain for 1 day. Last menstrual period July 08, 2018. EXAM: TRANSABDOMINAL AND TRANSVAGINAL ULTRASOUND OF PELVIS DOPPLER ULTRASOUND OF OVARIES TECHNIQUE: Both transabdominal and transvaginal ultrasound examinations of the pelvis were performed. Transabdominal technique was performed for global imaging of the pelvis  including uterus, ovaries, adnexal regions, and pelvic cul-de-sac. It was necessary to proceed with endovaginal exam following the transabdominal exam to visualize the endometrium and adnexa. Color and duplex Doppler ultrasound was utilized to evaluate blood flow to the ovaries. COMPARISON:  None. FINDINGS: Uterus Measurements: 9.1 x 4.8 x 6.5 cm = volume: 151 mL. No fibroids or other mass visualized. Endometrium Thickness: 6 mm.  No focal abnormality visualized. Right ovary  Measurements: 3 x 2.1 x 1.5 cm = volume: 5 mL. Normal appearance/no adnexal mass. Left ovary Measurements: 6.1 x 4.2 x 5.2 cm = volume: 69.3 mL. 4.1 x 2 x 3.8 cm thick irregular walled intra-ovarian cyst with 4 mm septation. Pulsed Doppler evaluation of both ovaries demonstrates normal low-resistance arterial and venous waveforms. Other findings Moderate volume free fluid. IMPRESSION: 1. Moderate volume free fluid. 2. LEFT ovarian 4.1 cm complex cyst with worrisome features (atypical appearance of ruptured adnexal cyst possible). Consider surgical evaluation. This recommendation follows the consensus statement: Management of Asymptomatic Ovarian and Other Adnexal Cysts Imaged at [place]: Society of Radiologists in Ultrasound Consensus Conference Statement. Radiology 2010; [phone]. 3. Acute findings discussed with and reconfirmed by Dr.Azalynn Maxim on 09/09/2018 at 3:15 pm. Electronically Signed   By: Awilda Metro M.D.   On: 09/09/2018 15:16   US Pelvis Complete  Result Date: 09/09/2018 CLINICAL DATA:  RIGHT > LEFT pelvic pain for 1 day. Last menstrual period July 08, 2018. EXAM: TRANSABDOMINAL AND TRANSVAGINAL ULTRASOUND OF PELVIS DOPPLER ULTRASOUND OF OVARIES TECHNIQUE: Both transabdominal and transvaginal ultrasound examinations of the pelvis were performed. Transabdominal technique was performed for global imaging of the pelvis including uterus, ovaries, adnexal regions, and pelvic cul-de-sac. It was necessary to proceed with  endovaginal exam following the transabdominal exam to visualize the endometrium and adnexa. Color and duplex Doppler ultrasound was utilized to evaluate blood flow to the ovaries. COMPARISON:  None. FINDINGS: Uterus Measurements: 9.1 x 4.8 x 6.5 cm = volume: 151 mL. No fibroids or other mass visualized. Endometrium Thickness: 6 mm.  No focal abnormality visualized. Right ovary Measurements: 3 x 2.1 x 1.5 cm = volume: 5 mL. Normal appearance/no adnexal mass. Left ovary Measurements: 6.1 x 4.2 x 5.2 cm = volume: 69.3 mL. 4.1 x 2 x 3.8 cm thick irregular walled intra-ovarian cyst with 4 mm septation. Pulsed Doppler evaluation of both ovaries demonstrates normal low-resistance arterial and venous waveforms. Other findings Moderate volume free fluid. IMPRESSION: 1. Moderate volume free fluid. 2. LEFT ovarian 4.1 cm complex cyst with worrisome features (atypical appearance of ruptured adnexal cyst possible). Consider surgical evaluation. This recommendation follows the consensus statement: Management of Asymptomatic Ovarian and Other Adnexal Cysts Imaged at [place]: Society of Radiologists in Ultrasound Consensus Conference Statement. Radiology 2010; [phone]. 3. Acute findings discussed with and reconfirmed by Dr.Charlea Nardo on 09/09/2018 at 3:15 pm. Electronically Signed   By: Awilda Metro M.D.   On: 09/09/2018 15:16   Ct Abdomen Pelvis W Contrast  Result Date: 09/09/2018 CLINICAL DATA:  Lower abdominal pain, now localized to the RIGHT lower quadrant. Vomiting. EXAM: CT ABDOMEN AND PELVIS WITH CONTRAST TECHNIQUE: Multidetector CT imaging of the abdomen and pelvis was performed using the standard protocol following bolus administration of intravenous contrast. CONTRAST:  OMNIPAQUE IOHEXOL 300 MG/ML  SOLN COMPARISON:  Pelvic ultrasound September 09, 2018 FINDINGS: LOWER CHEST: Lung bases are clear. Included heart size is normal. No pericardial effusion. HEPATOBILIARY: Liver and gallbladder are normal. PANCREAS:  Normal. SPLEEN: Subcapsular parenchymal linear hypodensity with trace pericapsular free fluid. ADRENALS/URINARY TRACT: Kidneys are orthotopic, demonstrating symmetric enhancement. No nephrolithiasis, hydronephrosis or solid renal masses. The unopacified ureters are normal in course and caliber. Urinary bladder is partially distended and unremarkable. Normal adrenal glands. STOMACH/BOWEL: The stomach, small and large bowel are normal in course and caliber without inflammatory changes. Normal appendix. VASCULAR/LYMPHATIC: Aortoiliac vessels are normal in course and caliber. No lymphadenopathy by CT size criteria. REPRODUCTIVE: 4.2 cm LEFT paraovarian cyst better  demonstrated on today's ultrasound. OTHER: Trace perihepatic free fluid. Small volume low-density free fluid in the pelvis including RIGHT lower quadrant it has. MUSCULOSKELETAL: Nonacute.  Thoracic Schmorl's nodes. IMPRESSION: 1. Small volume pericapsular free fluid with cleft versus grade 1 laceration. This can be seen with trauma or Epstein Barr virus. Alternatively, this could reflect a cleft and free fluid tracking to LEFT upper quadrant. 2. 4 cm complex LEFT ovarian cyst better demonstrated on today's stealth ago ultrasound. Small volume free fluid in the pelvis, possibly related to ruptured adnexal cyst. 3. Acute findings discussed with and reconfirmed by Dr.Hala Narula on 09/09/2018 at 5:33 pm. Electronically Signed   By: Awilda Metro M.D.   On: 09/09/2018 17:33   [place] Art/ven Flow Abd Pelv Doppler  Result Date: 09/09/2018 CLINICAL DATA:  RIGHT > LEFT pelvic pain for 1 day. Last menstrual period July 08, 2018. EXAM: TRANSABDOMINAL AND TRANSVAGINAL ULTRASOUND OF PELVIS DOPPLER ULTRASOUND OF OVARIES TECHNIQUE: Both transabdominal and transvaginal ultrasound examinations of the pelvis were performed. Transabdominal technique was performed for global imaging of the pelvis including uterus, ovaries, adnexal regions, and pelvic cul-de-sac. It was  necessary to proceed with endovaginal exam following the transabdominal exam to visualize the endometrium and adnexa. Color and duplex Doppler ultrasound was utilized to evaluate blood flow to the ovaries. COMPARISON:  None. FINDINGS: Uterus Measurements: 9.1 x 4.8 x 6.5 cm = volume: 151 mL. No fibroids or other mass visualized. Endometrium Thickness: 6 mm.  No focal abnormality visualized. Right ovary Measurements: 3 x 2.1 x 1.5 cm = volume: 5 mL. Normal appearance/no adnexal mass. Left ovary Measurements: 6.1 x 4.2 x 5.2 cm = volume: 69.3 mL. 4.1 x 2 x 3.8 cm thick irregular walled intra-ovarian cyst with 4 mm septation. Pulsed Doppler evaluation of both ovaries demonstrates normal low-resistance arterial and venous waveforms. Other findings Moderate volume free fluid. IMPRESSION: 1. Moderate volume free fluid. 2. LEFT ovarian 4.1 cm complex cyst with worrisome features (atypical appearance of ruptured adnexal cyst possible). Consider surgical evaluation. This recommendation follows the consensus statement: Management of Asymptomatic Ovarian and Other Adnexal Cysts Imaged at [place]: Society of Radiologists in Ultrasound Consensus Conference Statement. Radiology 2010; [phone]. 3. Acute findings discussed with and reconfirmed by Dr.Nevin Kozuch on 09/09/2018 at 3:15 pm. Electronically Signed   By: Awilda Metro M.D.   On: 09/09/2018 15:16    Procedures Procedures (including critical care time)  Medications Ordered in ED Medications  ketorolac (TORADOL) injection 60 mg (60 mg Intramuscular Given 09/09/18 1343)  iohexol (OMNIPAQUE) 300 MG/ML solution 100 mL (100 mLs Intravenous Contrast Given 09/09/18 1707)     Initial Impression / Assessment and Plan / ED Course  I have reviewed the triage vital signs and the nursing notes.  Pertinent labs & imaging results that were available during my care of the patient were reviewed by me and considered in my medical decision making (see chart for  details).  Clinical Course as of Sep 09 1755  Wed Sep 09, 2018  1516 Radiology advises that pelvic ultrasound shows complex area adjacent to left ovary.  Free pelvic fluid greater than anticipated for physiologic fluid.  This is not consistent with the current finding.  Patient did have onset of left-sided pain but now is distinctly tender on the right side radiology suggests a CT scan to further elucidate findings.   [MP]  1744 Consult: Reviewed with Dr. Janee Morn at [facility] on call.  She advises findings are consistent with a ruptured cyst with free  fluid.  Patient can be treated for pain with ibuprofen and limited supply of hydrocodone with follow-up with GYN.  She can either call women's outpatient for appointment or go to her personal GYN physician.   [MP]    Clinical Course User Index [MP] Arby Barrette, MD       Patient symptoms started with severe left-sided pelvic pain that migrated to the right side over several hours in the morning.  She was well last night.  She had intercourse without pain.  Combination of pelvic ultrasound and CT suggest large left ovarian cyst that is ruptured with free fluid.  I have reviewed this with Dr. Janee Morn at [facility].  Findings are consistent with this process.  Patient is otherwise stable.  She is alert and well in appearance.  Will start pain control and have close follow-up with GYN.  Return precautions reviewed.  Final Clinical Impressions(s) / ED Diagnoses   Final diagnoses:  Complex ovarian cyst    ED Discharge Orders         Ordered    ibuprofen (ADVIL,MOTRIN) 800 MG tablet  3 times daily     09/09/18 1751    HYDROcodone-acetaminophen (NORCO/VICODIN) 5-325 MG tablet  Every 6 hours PRN     09/09/18 1751           Arby Barrette, MD 09/09/18 1802

## 2018-09-09 NOTE — ED Triage Notes (Signed)
Pt in c/o lower abd pain that started in the LLQ and now is RLQ onset acutely today, with x 1 emesis today, c/o nausea, denies diarrhea, pt has last menses x 2 mths ago, pt A&O x4

## 2018-09-09 NOTE — Discharge Instructions (Signed)
1.  Take ibuprofen for pain.  If needed for severe pain take a Vicodin tablet every 6 hours.  Apply heating pad to your abdomen. 2.  Call women's outpatient clinic or your gynecologist in [place] to schedule a recheck within the next 1 to 3 days. 3.  If your pain is significantly worsening, you feel lightheaded or dizzy, you develop fever or other concerning symptoms, go to [facility] Emergency Department on the [facility] campus.

## 2018-09-10 LAB — GC/CHLAMYDIA PROBE AMP (~~LOC~~) NOT AT ARMC
Chlamydia: NEGATIVE
Neisseria Gonorrhea: NEGATIVE

## 2018-09-10 LAB — URINE CULTURE: CULTURE: NO GROWTH

## 2018-09-11 ENCOUNTER — Ambulatory Visit: Payer: Self-pay | Admitting: Obstetrics & Gynecology

## 2018-10-13 ENCOUNTER — Telehealth: Payer: Self-pay

## 2018-10-13 ENCOUNTER — Emergency Department
Admission: EM | Admit: 2018-10-13 | Discharge: 2018-10-13 | Disposition: A | Discharge status: Home / Self Care | Payer: Self-pay | Attending: Emergency Medicine | Admitting: Emergency Medicine

## 2018-10-13 ENCOUNTER — Encounter: Payer: Self-pay | Admitting: Emergency Medicine

## 2018-10-13 ENCOUNTER — Other Ambulatory Visit: Payer: Self-pay

## 2018-10-13 ENCOUNTER — Emergency Department: Payer: Self-pay

## 2018-10-13 DIAGNOSIS — R102 Pelvic and perineal pain: Secondary | ICD-10-CM | POA: Insufficient documentation

## 2018-10-13 DIAGNOSIS — N83209 Unspecified ovarian cyst, unspecified side: Secondary | ICD-10-CM

## 2018-10-13 DIAGNOSIS — N83291 Other ovarian cyst, right side: Secondary | ICD-10-CM | POA: Insufficient documentation

## 2018-10-13 DIAGNOSIS — N83201 Unspecified ovarian cyst, right side: Secondary | ICD-10-CM

## 2018-10-13 HISTORY — DX: Unspecified ovarian cyst, unspecified side: N83.209

## 2018-10-13 LAB — COMPREHENSIVE METABOLIC PANEL
ALT: 16 U/L (ref 0–44)
AST: 15 U/L (ref 15–41)
Albumin: 4 g/dL (ref 3.5–5.0)
Alkaline Phosphatase: 80 U/L (ref 38–126)
Anion gap: 9 (ref 5–15)
BUN: 13 mg/dL (ref 6–20)
CO2: 23 mmol/L (ref 22–32)
Calcium: 8.9 mg/dL (ref 8.9–10.3)
Chloride: 109 mmol/L (ref 98–111)
Creatinine, Ser: 0.58 mg/dL (ref 0.44–1.00)
GFR calc Af Amer: >60 mL/min (ref >60)
GFR calc non Af Amer: >60 mL/min (ref >60)
Glucose, Bld: 94 mg/dL (ref 70–99)
Potassium: 3.6 mmol/L (ref 3.5–5.1)
Sodium: 141 mmol/L (ref 135–145)
Total Bilirubin: 0.6 mg/dL (ref 0.3–1.2)
Total Protein: 7 g/dL (ref 6.5–8.1)

## 2018-10-13 LAB — CBC WITH DIFFERENTIAL/PLATELET
Abs Immature Granulocytes: 0.01 10*3/uL (ref 0.00–0.07)
Basophils Absolute: 0 10*3/uL (ref 0.0–0.1)
Basophils Relative: 0 %
Eosinophils Absolute: 0.1 10*3/uL (ref 0.0–0.5)
Eosinophils Relative: 1 %
HCT: 38 % (ref 36.0–46.0)
Hemoglobin: 12.6 g/dL (ref 12.0–15.0)
Immature Granulocytes: 0 %
Lymphocytes Relative: 40 %
Lymphs Abs: 2.3 10*3/uL (ref 0.7–4.0)
MCH: 28.3 pg (ref 26.0–34.0)
MCHC: 33.2 g/dL (ref 30.0–36.0)
MCV: 85.2 fL (ref 80.0–100.0)
Monocytes Absolute: 0.4 10*3/uL (ref 0.1–1.0)
Monocytes Relative: 7 %
Neutro Abs: 3.1 10*3/uL (ref 1.7–7.7)
Neutrophils Relative %: 52 %
Platelets: 171 10*3/uL (ref 150–400)
RBC: 4.46 MIL/uL (ref 3.87–5.11)
RDW: 12.7 % (ref 11.5–15.5)
WBC: 5.9 10*3/uL (ref 4.0–10.5)
nRBC: 0 % (ref 0.0–0.2)

## 2018-10-13 LAB — URINALYSIS, COMPLETE (UACMP) WITH MICROSCOPIC
Bacteria, UA: NONE SEEN
Bilirubin Urine: NEGATIVE
Glucose, UA: NEGATIVE mg/dL
Ketones, ur: NEGATIVE mg/dL
Leukocytes,Ua: NEGATIVE
Nitrite: NEGATIVE
Protein, ur: NEGATIVE mg/dL
Specific Gravity, Urine: 1.026 (ref 1.005–1.030)
pH: 5 (ref 5.0–8.0)

## 2018-10-13 LAB — WET PREP, GENITAL
Clue Cells Wet Prep HPF POC: NONE SEEN
Sperm: NONE SEEN
Trich, Wet Prep: NONE SEEN
Yeast Wet Prep HPF POC: NONE SEEN

## 2018-10-13 LAB — CHLAMYDIA/NGC RT PCR (ARMC ONLY)
Chlamydia Tr: NOT DETECTED
N gonorrhoeae: NOT DETECTED

## 2018-10-13 LAB — POCT PREGNANCY, URINE: Preg Test, Ur: NEGATIVE

## 2018-10-13 LAB — LIPASE, BLOOD: Lipase: 22 U/L (ref 11–51)

## 2018-10-13 MED ORDER — IBUPROFEN 800 MG PO TABS
800.0000 mg | ORAL_TABLET | Freq: Once | ORAL | Status: AC
  Administered 2018-10-13: 800 mg via ORAL
  Filled 2018-10-13: qty 1

## 2018-10-13 NOTE — ED Provider Notes (Signed)
[facility] Emergency Department Provider Note       Time seen: ----------------------------------------- 1:41 PM on 10/13/2018 -----------------------------------------   I have reviewed the triage vital signs and the nursing notes.  HISTORY   Chief Complaint pelvic pain    HPI Linda Choi is a 23 y.o. female with a history of anemia, ovarian cyst who presents to the ED for lower abdominal pain.  Patient reports pain across her whole abdomen, feels like when she had an ovarian cyst in the past.  Pain is currently 6 out of 10.  Patient reports she has had some mucousy discharge, does not think she could be pregnant, is not taking birth control.  Past Medical History:  Diagnosis Date  . Anemia   . Cervical incompetence   . Ovarian cyst   . Type A blood, Rh negative     Patient Active Problem List   Diagnosis Date Noted  . Normal labor and delivery 04/28/2017  . UTI (urinary tract infection) 04/26/2017  . Pregnancy 02/13/2017  . History of macrosomia in infant in prior pregnancy, currently pregnant 01/23/2017  . History of pregnancy loss in prior pregnancy, currently pregnant in first trimester 10/03/2016  . High risk pregnancy, antepartum 10/03/2016  . Rh negative state in antepartum period 10/03/2016    Past Surgical History:  Procedure Laterality Date  . NO PAST SURGERIES      Allergies Patient has no known allergies.  Social History Social History   Tobacco Use  . Smoking status: Never Smoker  . Smokeless tobacco: Never Used  Substance Use Topics  . Alcohol use: No  . Drug use: No   Review of Systems Constitutional: Negative for fever. Cardiovascular: Negative for chest pain. Respiratory: Negative for shortness of breath. Gastrointestinal: Positive for abdominal pain Genitourinary: Negative for dysuria. Musculoskeletal: Negative for back pain. Skin: Negative for rash. Neurological: Negative for headaches, focal  weakness or numbness.  All systems negative/normal/unremarkable except as stated in the HPI  ____________________________________________   PHYSICAL EXAM:  VITAL SIGNS: ED Triage Vitals  Enc Vitals Group     BP --      Pulse --      Resp --      Temp --      Temp src --      SpO2 --      Weight 10/13/18 1335 175 lb (79.4 kg)     Height 10/13/18 1335 5\' 4"  (1.626 m)     Head Circumference --      Peak Flow --      Pain Score 10/13/18 1334 6     Pain Loc --      Pain Edu? --      Excl. in GC? --    Constitutional: Alert and oriented. Well appearing and in no distress. Eyes: Conjunctivae are normal. Normal extraocular movements. ENT      Head: Normocephalic and atraumatic.      Nose: No congestion/rhinnorhea.      Mouth/Throat: Mucous membranes are moist.      Neck: No stridor. Cardiovascular: Normal rate, regular rhythm. No murmurs, rubs, or gallops. Respiratory: Normal respiratory effort without tachypnea nor retractions. Breath sounds are clear and equal bilaterally. No wheezes/rales/rhonchi. Gastrointestinal: Soft and nontender. Normal bowel sounds Musculoskeletal: Nontender with normal range of motion in extremities. No lower extremity tenderness nor edema. Neurologic:  Normal speech and language. No gross focal neurologic deficits are appreciated.  Skin:  Skin is warm, dry and intact. No rash noted.  Psychiatric: Mood and affect are normal. Speech and behavior are normal.  ____________________________________________  ED COURSE:  As part of my medical decision making, I reviewed the following data within the electronic MEDICAL RECORD NUMBER History obtained from family if available, nursing notes, old chart and ekg, as well as notes from prior ED visits. Patient presented for abdominal pain, we will assess with labs and imaging as indicated at this time.   Procedures  Linda Choi was evaluated in Emergency Department on 10/13/2018 for the symptoms described in the  history of present illness. She was evaluated in the context of the global COVID-19 pandemic, which necessitated consideration that the patient might be at risk for infection with the SARS-CoV-2 virus that causes COVID-19. Institutional protocols and algorithms that pertain to the evaluation of patients at risk for COVID-19 are in a state of rapid change based on information released by regulatory bodies including the CDC and federal and state organizations. These policies and algorithms were followed during the patient's care in the ED.  ____________________________________________   LABS (pertinent positives/negatives)  Labs Reviewed  URINALYSIS, COMPLETE (UACMP) WITH MICROSCOPIC - Abnormal; Notable for the following components:      Result Value   Color, Urine YELLOW (*)    APPearance HAZY (*)    Hgb urine dipstick LARGE (*)    All other components within normal limits  CBC WITH DIFFERENTIAL/PLATELET  COMPREHENSIVE METABOLIC PANEL  LIPASE, BLOOD  POC URINE PREG, ED  POCT PREGNANCY, URINE    RADIOLOGY  Pelvic ultrasound Is pending at this time ____________________________________________   DIFFERENTIAL DIAGNOSIS   Ovarian cyst, torsion, PID, cervicitis, gas pain, UTI  FINAL ASSESSMENT AND PLAN  Pelvic pain   Plan: The patient had presented for pelvic pain. Patient's labs and imaging are still pending at this time.   Ulice Dash, MD    Note: This note was generated in part or whole with voice recognition software. Voice recognition is usually quite accurate but there are transcription errors that can and very often do occur. I apologize for any typographical errors that were not detected and corrected.     Emily Filbert, MD 10/13/18 1534

## 2018-10-13 NOTE — Telephone Encounter (Signed)
Pt calling about ovarian cyst.  [phone]  Left detailed msg she is in [facility]  ED.  That's where she needs to be if in that much pain.

## 2018-10-13 NOTE — ED Triage Notes (Signed)
Here for lower abdominal pain. Per pt across whole lower abdomen, not one sided.  Feels like when pt had ovarian cyst before.  Ambulatory. NAD.

## 2018-10-13 NOTE — ED Provider Notes (Signed)
-----------------------------------------   4:38 PM on 10/13/2018 -----------------------------------------  Ultrasound shows likely right ovarian hemorrhagic cyst with possible rupture.  Patient states her pain is more in the middle and may be even more to the left side.  No left-sided abnormality on ultrasound noted.  Lab work is reassuring.  Perform pelvic examination that it was largely nontender mild amount of bleeding but no significant abnormal discharge.  We sent swabs as a precaution.  We will discharge the patient home with PCP follow-up.  Patient agreeable to plan of care.   Minna Antis, MD 10/13/18 [phone]

## 2018-10-13 NOTE — ED Notes (Signed)
Patient transported to Ultrasound 

## 2018-10-13 NOTE — ED Notes (Signed)
This RN at bedside with EDP Paduchowski to assist with pelvic exam.

## 2018-10-13 NOTE — ED Notes (Signed)
Pt denies any other needs. Resting in bed with blankets and call bell within reach. Bed in lowest position. Rail up.

## 2018-10-13 NOTE — ED Notes (Signed)
OBGYN cart to bedside. Pt not yet back from [place].

## 2018-10-13 NOTE — ED Notes (Signed)
Pt not yet back to room. Will give meds and continue tasks once back.

## 2018-11-24 ENCOUNTER — Telehealth: Payer: Self-pay

## 2018-11-24 NOTE — Telephone Encounter (Signed)
[email or web address]

## 2018-11-24 NOTE — Telephone Encounter (Signed)
Pls have pt sched appt. I'm not back till 12/07/18 so can be with anyone. Thx

## 2018-11-24 NOTE — Telephone Encounter (Signed)
Pt started period on Sunday. It was very light (mucus w/blood streaks in it). Yesterday she bleed thru 4 pairs of pants & has bleed thru 4 pairs today as well. Inquiring if needs office visit or ER? [id number].

## 2018-11-24 NOTE — Telephone Encounter (Signed)
Spoke w/patient. She is wearing a long pad & today started using 2. Is changing every 2 hours. States she went to ER 10/13/2018 & was told she had a cyst but wasn't anything to worry about. Not currently on birth control.

## 2018-11-25 NOTE — Telephone Encounter (Signed)
Can you please schedule pt with any provider at her earliest convenience, thank you!

## 2018-11-27 ENCOUNTER — Ambulatory Visit: Payer: Self-pay | Admitting: Obstetrics and Gynecology

## 2018-12-15 ENCOUNTER — Encounter: Payer: Self-pay | Admitting: Emergency Medicine

## 2018-12-15 ENCOUNTER — Other Ambulatory Visit: Payer: Self-pay

## 2018-12-15 ENCOUNTER — Emergency Department
Admission: EM | Admit: 2018-12-15 | Discharge: 2018-12-15 | Disposition: A | Discharge status: Home / Self Care | Payer: Self-pay | Attending: Emergency Medicine | Admitting: Emergency Medicine

## 2018-12-15 DIAGNOSIS — K047 Periapical abscess without sinus: Secondary | ICD-10-CM | POA: Insufficient documentation

## 2018-12-15 MED ORDER — AMOXICILLIN 875 MG PO TABS: 875.0000 mg | ORAL_TABLET | Freq: Two times a day (BID) | ORAL | 0 refills | Status: DC

## 2018-12-15 MED ORDER — AMOXICILLIN 400 MG/5ML PO SUSR: 500.0000 mg | Freq: Three times a day (TID) | ORAL | 0 refills | Status: AC

## 2018-12-15 NOTE — ED Provider Notes (Signed)
[facility] Emergency Department Provider Note  ____________________________________________  Time seen: Approximately 10:52 PM  I have reviewed the triage vital signs and the nursing notes.   HISTORY  Chief Complaint Oral Swelling    HPI Linda Choi is a 23 y.o. female presents to the emergency department with right-sided upper jaw swelling that has occurred for the past 3 days.  Patient conveys that several years ago she had dental pain around superior [address] and dentist told her that she would need root canal.  Patient denies having current dental pain.  She denies pain underneath the tongue or difficulty swallowing.  No swelling of the neck.  No fever or chills.  No other alleviating measures have been attempted.        Past Medical History:  Diagnosis Date  . Anemia   . Cervical incompetence   . Ovarian cyst   . Type A blood, Rh negative     Patient Active Problem List   Diagnosis Date Noted  . Normal labor and delivery 04/28/2017  . UTI (urinary tract infection) 04/26/2017  . Pregnancy 02/13/2017  . History of macrosomia in infant in prior pregnancy, currently pregnant 01/23/2017  . History of pregnancy loss in prior pregnancy, currently pregnant in first trimester 10/03/2016  . High risk pregnancy, antepartum 10/03/2016  . Rh negative state in antepartum period 10/03/2016    Past Surgical History:  Procedure Laterality Date  . NO PAST SURGERIES      Prior to Admission medications   Medication Sig Start Date End Date Taking? Authorizing Provider  amoxicillin (AMOXIL) 400 MG/5ML suspension Take 6.3 mLs (500 mg total) by mouth 3 (three) times daily for 10 days. 12/15/18 12/25/18  Orvil FeilWoods, Weldon Nouri M, PA-C    Allergies Patient has no known allergies.  Family History  Problem Relation Age of Onset  . Eclampsia Mother   . Breast cancer Neg Hx   . Ovarian cancer Neg Hx     Social History Social History   Tobacco Use  . Smoking  status: Never Smoker  . Smokeless tobacco: Never Used  Substance Use Topics  . Alcohol use: No  . Drug use: No     Review of Systems  Constitutional: Patient has facial swelling.  Eyes: No visual changes. No discharge ENT: Patient has right upper jaw swelling.  Cardiovascular: no chest pain. Respiratory: no cough. No SOB. Gastrointestinal: No abdominal pain.  No nausea, no vomiting.  No diarrhea.  No constipation. Musculoskeletal: Negative for musculoskeletal pain. Skin: Negative for rash, abrasions, lacerations, ecchymosis. Neurological: Negative for headaches, focal weakness or numbness.  ____________________________________________   PHYSICAL EXAM:  VITAL SIGNS: ED Triage Vitals  Enc Vitals Group     BP 12/15/18 2013 127/63     Pulse Rate 12/15/18 2013 88     Resp 12/15/18 2013 20     Temp 12/15/18 2013 98.1 F (36.7 C)     Temp Source 12/15/18 2013 Oral     SpO2 12/15/18 2013 100 %     Weight 12/15/18 2014 170 lb (77.1 kg)     Height 12/15/18 2014 5\' 4"  (1.626 m)     Head Circumference --      Peak Flow --      Pain Score 12/15/18 2013 7     Pain Loc --      Pain Edu? --      Excl. in [place]? --      Constitutional: Alert and oriented. Well appearing and in no  acute distress. Eyes: Conjunctivae are normal. PERRL. EOMI. Head: Atraumatic. ENT:      Nose: No congestion/rhinnorhea.      Mouth/Throat: Mucous membranes are moist.  Patient has swelling of right upper jaw.  Multiple dental caries visualized.  Uvula is midline. Hematological/Lymphatic/Immunilogical: No cervical lymphadenopathy.  Cardiovascular: Normal rate, regular rhythm. Normal S1 and S2.  Good peripheral circulation. Respiratory: Normal respiratory effort without tachypnea or retractions. Lungs CTAB. Good air entry to the bases with no decreased or absent breath sounds.  Skin:  Skin is warm, dry and intact. No rash noted. Psychiatric: Mood and affect are normal. Speech and behavior are normal. Patient  exhibits appropriate insight and judgement.   ____________________________________________   LABS (all labs ordered are listed, but only abnormal results are displayed)  Labs Reviewed - No data to display ____________________________________________  EKG   ____________________________________________  RADIOLOGY  No results found.  ____________________________________________    PROCEDURES  Procedure(s) performed:    Procedures    Medications - No data to display   ____________________________________________   INITIAL IMPRESSION / ASSESSMENT AND PLAN / ED COURSE  Pertinent labs & imaging results that were available during my care of the patient were reviewed by me and considered in my medical decision making (see chart for details).  Review of the [place] CSRS was performed in accordance of the [place] prior to dispensing any controlled drugs.           Assessment and plan Dental abscess Patient presents to the emergency department with right upper jaw swelling.  History and physical exam findings suggest dental abscess.  Patient was started on amoxicillin.  She was advised to take ibuprofen for discomfort.  All patient questions were answered.  Patient was advised to seek care with local dentist as soon as possible.    ____________________________________________  FINAL CLINICAL IMPRESSION(S) / ED DIAGNOSES  Final diagnoses:  Dental abscess      NEW MEDICATIONS STARTED DURING THIS VISIT:  ED Discharge Orders         Ordered    amoxicillin (AMOXIL) 875 MG tablet  2 times daily,   Status:  Discontinued     12/15/18 2120    amoxicillin (AMOXIL) 400 MG/5ML suspension  3 times daily     12/15/18 2128              This chart was dictated using voice recognition software/Dragon. Despite best efforts to proofread, errors can occur which can change the meaning. Any change was purely unintentional.    Lannie Fields, PA-C 12/15/18 2311     Delman Kitten, MD 12/15/18 [phone]

## 2018-12-15 NOTE — ED Triage Notes (Signed)
Patient ambulatory to triage with steady gait, without difficulty or distress noted,mask in place; st Sunday awoke with swelling & pain to right cheek; denies any dental pain, no fever, no recent illness

## 2018-12-15 NOTE — Discharge Instructions (Signed)
OPTIONS FOR DENTAL FOLLOW UP CARE ° °[place] Department of Health and Human Services - Local Safety Net Dental Clinics °http://www.ncdhhs.gov/dph/oralhealth/services/safetynetclinics.htm °  °[facility] (336-562-3123) ° °[facility] [place] (919-933-9087) ° °[facility] ([phone] ext [phone]) ° °[facility] ([phone]) ° °[facility] ([phone]) °This clinic caters to the indigent population and is on a lottery system. °Location: °[facility], [address], [place] °Clinic Hours: °Wednesdays from 6pm - 9pm, patients seen by a lottery system. °For dates, call or go to www.med.[facility].edu/[facility]/patients/Dental-[facility] °Services: °Cleanings, fillings and simple extractions. °Payment Options: °DENTAL WORK IS FREE OF CHARGE. Bring proof of income or support. °Best way to get seen: °Arrive at 5:15 pm - this is a lottery, NOT first come/first serve, so arriving earlier will not increase your chances of being seen. °  °  °[facility] °[phone] °Select option 1 for emergencies °  °Location: °[facility], [address], [place] °Clinic Hours: °No walk-ins accepted - call the day before to schedule an appointment. °Check in times are 9:30 am and 1:30 pm. °Services: °Simple extractions, temporary fillings, pulpectomy/pulp debridement, uncomplicated abscess drainage. °Payment Options: °PAYMENT IS DUE AT THE TIME OF SERVICE.  Fee is usually $100-200, additional surgical procedures (e.g. abscess drainage) may be extra. °Cash, checks, Visa/MasterCard accepted.  Can file Medicaid if patient is covered for dental - patient should call case worker to check. °No discount for [facility] patients. °Best way to get seen: °MUST call the day before and get onto the schedule. Can usually be seen the next 1-2 days. No walk-ins accepted. °  °  °[facility] °[phone] °   °Location: °[facility], [address], [place] °Clinic Hours: °M, W, Th, F 8am or 1:30pm, Tues 9a or 1:30 - first come/first served. °Services: °Simple extractions, temporary fillings, uncomplicated abscess drainage.  You do not need to be an [place] resident. °Payment Options: °PAYMENT IS DUE AT THE TIME OF SERVICE. °Dental insurance, otherwise sliding scale - bring proof of income or support. °Depending on income and treatment needed, cost is usually $50-200. °Best way to get seen: °Arrive early as it is first come/first served. °  °  °[facility] °[phone] °  °Location: °[address] °Clinic Hours: °Mon-Thu 8a-5p °Services: °Most basic dental services including extractions and fillings. °Payment Options: °PAYMENT IS DUE AT THE TIME OF SERVICE. °Sliding scale, up to 50% off - bring proof if income or support. °Medicaid with dental option accepted. °Best way to get seen: °Call to schedule an appointment, can usually be seen within 2 weeks OR they will try to see walk-ins - show up at 8a or 2p (you may have to wait). °  °  °[facility] °919-245-2435 °[place] RESIDENTS ONLY °  °Location: °[facility], [address], [place], [place] [address] °Clinic Hours: By appointment only. °Monday - Thursday 8am-5pm, Friday 8am-12pm °Services: Cleanings, fillings, extractions. °Payment Options: °PAYMENT IS DUE AT THE TIME OF SERVICE. °Cash, Visa or MasterCard. Sliding scale - $30 minimum per service. °Best way to get seen: °Come in to office, complete packet and make an appointment - need proof of income °or support monies for each household member and proof of [place] residence. °Usually takes about a month to get in. °  °  °[facility] °[phone] °  °Location: °[address]   [place] °Clinic Hours: Walk-in Urgent Care Dental Services are offered Monday-Friday  mornings only. °The numbers of emergencies accepted daily is limited to the number of °providers available. °Maximum 15 - Mondays, Wednesdays & Thursdays °Maximum 10 - Tuesdays & Fridays °Services: °You do not need to be a [place] resident to be seen for a dental emergency. °Emergencies are defined as pain, swelling, abnormal bleeding, or dental trauma. Walkins will receive x-rays if needed. °NOTE: Dental cleaning is not an emergency. °Payment Options: °PAYMENT IS DUE AT THE TIME OF SERVICE. °Minimum co-pay is $40.00 for uninsured patients. °Minimum co-pay is $3.00 for Medicaid with dental coverage. °Dental Insurance is accepted and must be presented at time of visit. °Medicare does not cover dental. °Forms of payment: Cash, credit card, checks. °Best way to get seen: °If not previously registered with the clinic, walk-in dental registration begins at 7:15 am and is on a first come/first serve basis. °If previously registered with the clinic, call to make an appointment. °  °  °[facility] °[phone] °[place] RESIDENTS ONLY °  °Location: °[address], [place], [place] °Clinic Hours: °Mon-Thu 10a-2p °Services: Extractions only! °Payment Options: °FREE (donations accepted) - bring proof of income or support °Best way to get seen: °Call and schedule an appointment OR come at 8am on the 1st Monday of every month (except for holidays) when it is first come/first served. °  °  °[facility] °[phone] °  °Location: °[address], [place] °Clinic Hours: °Friday mornings °Services, Payment Options, Best way to get seen: °Call for info °

## 2019-02-25 IMAGING — US US OB COMP +14 WK
1 series · 13 of 28 positions shown · non-contrast
Comparison: none

CLINICAL DATA: Vaginal bleeding in third trimester pregnancy.
Premature contractions. Current assigned gestational age of 31 weeks
3 days.

EXAM:
OBSTETRICAL ULTRASOUND >14 WKS

[Series 1: us ob comp +14 wk · 0.26mm/px · 13 of 77 slices shown]
[im 3/77]
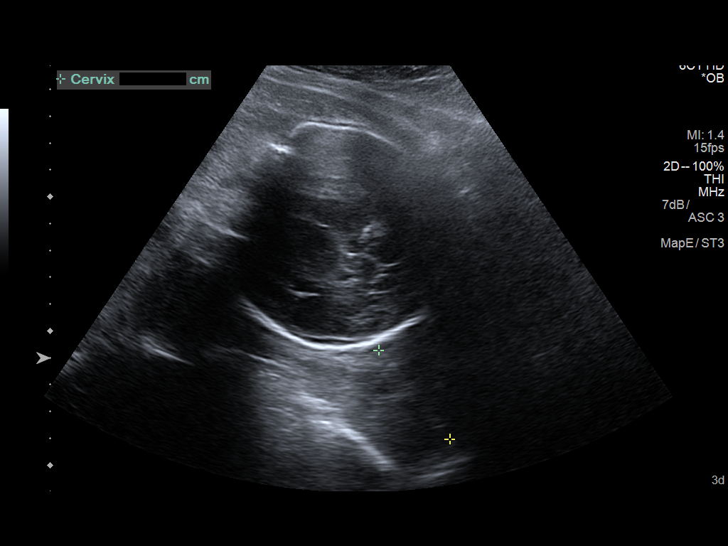
[im 9/77]
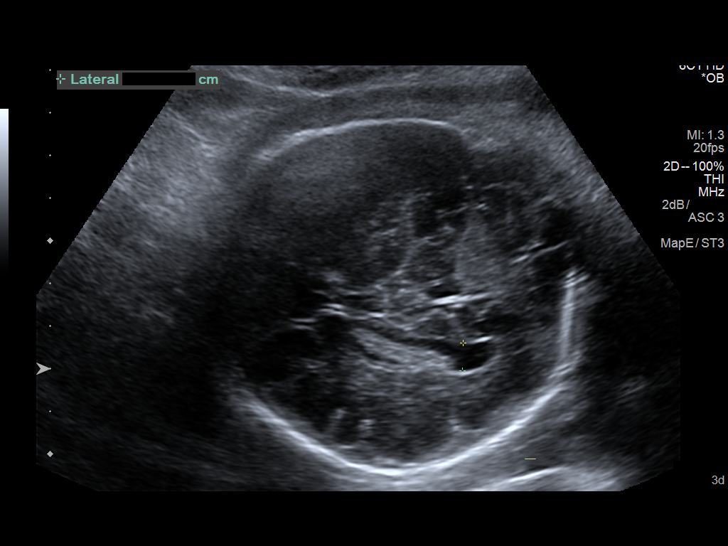
[im 15/77]
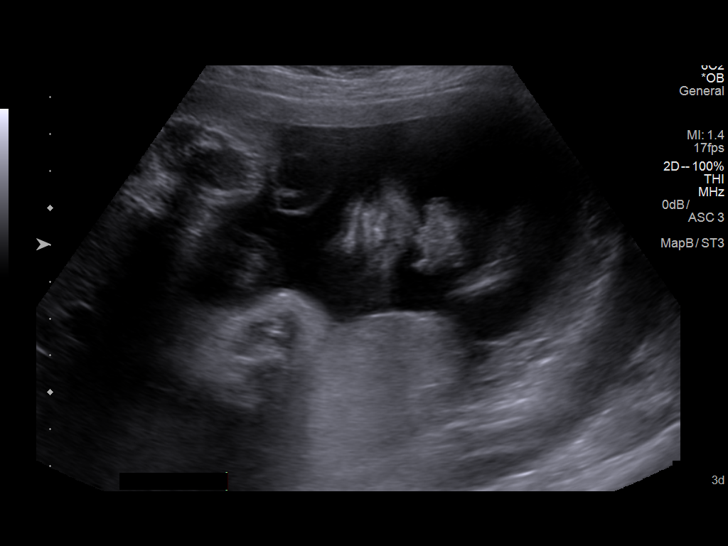
[im 20/77]
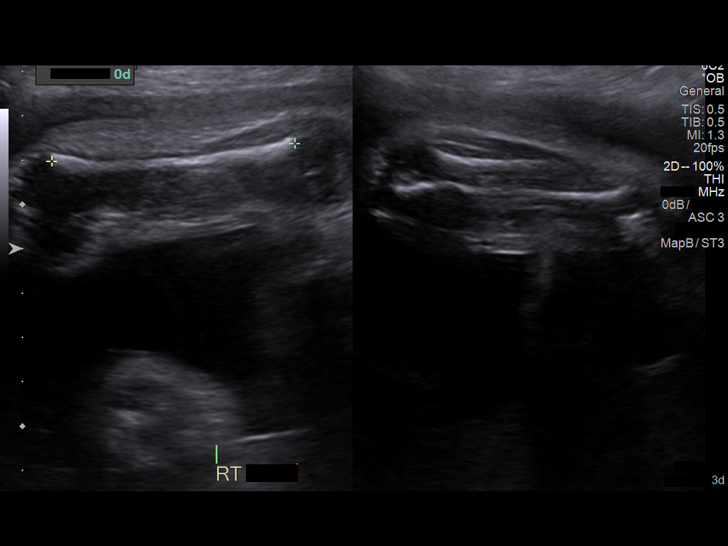
[im 26/77]
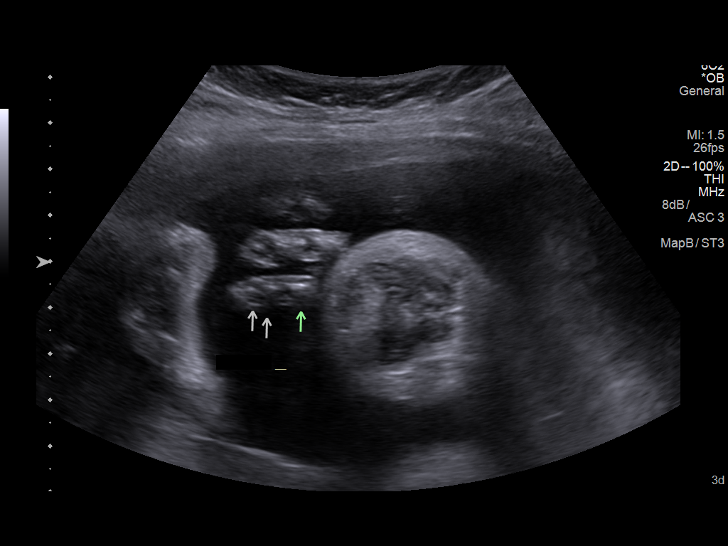
[im 31/77]
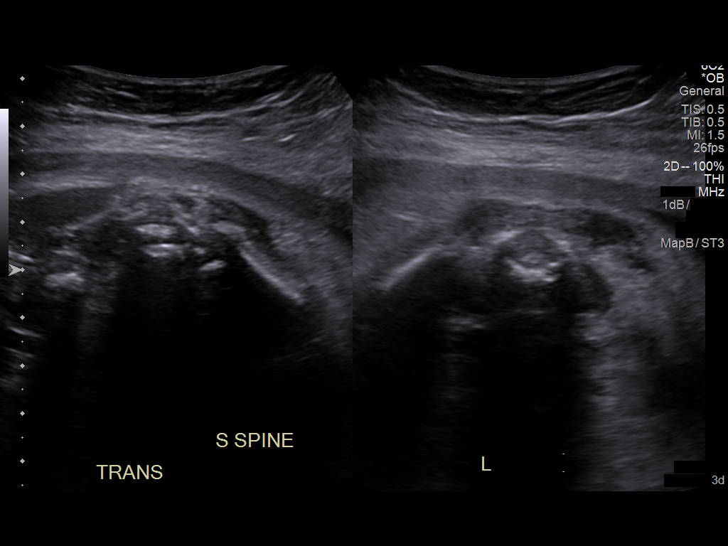
[im 40/77]
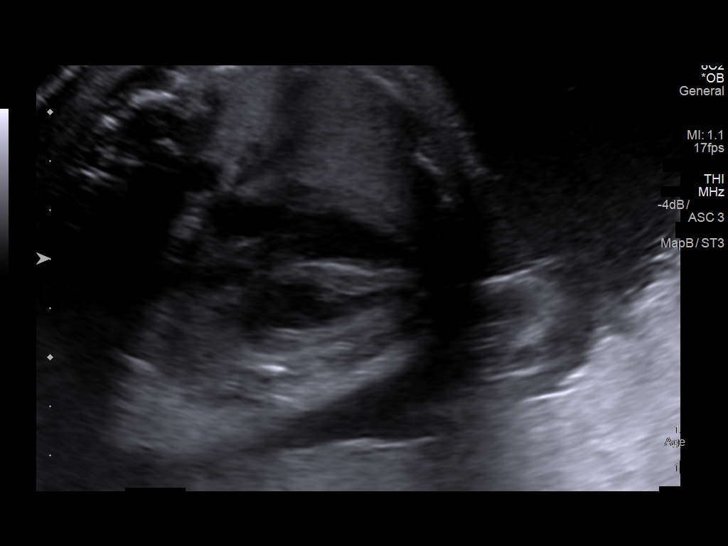
[im 46/77]
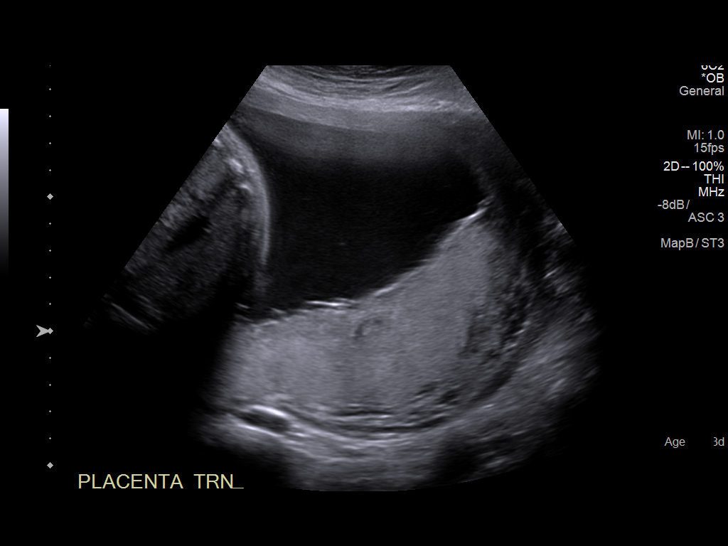
[im 51/77]
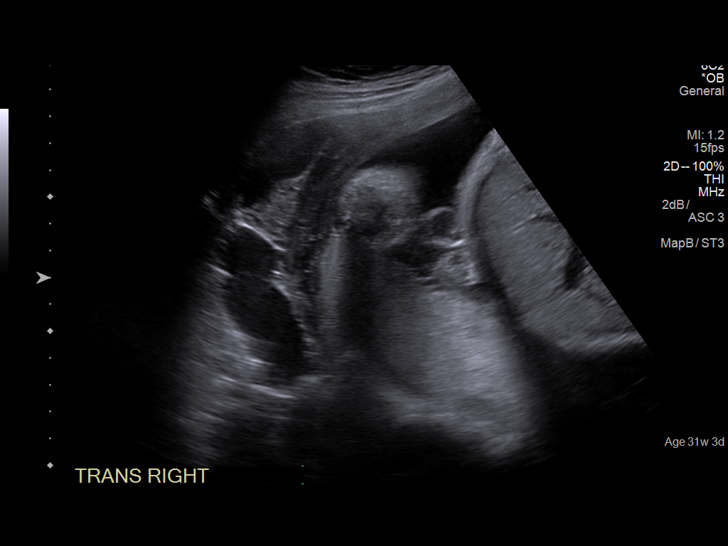
[im 57/77]
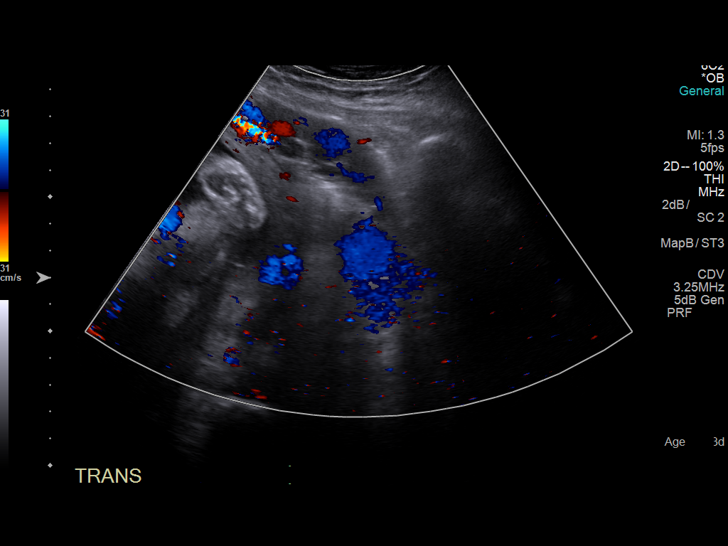
[im 62/77]
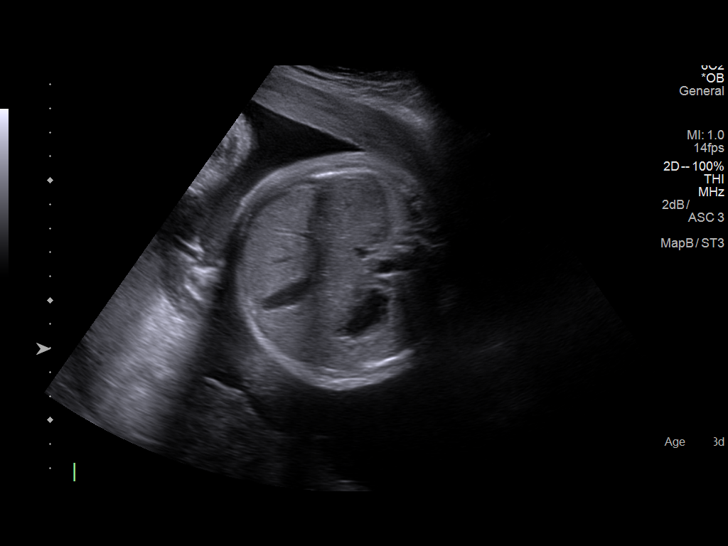
[im 68/77]
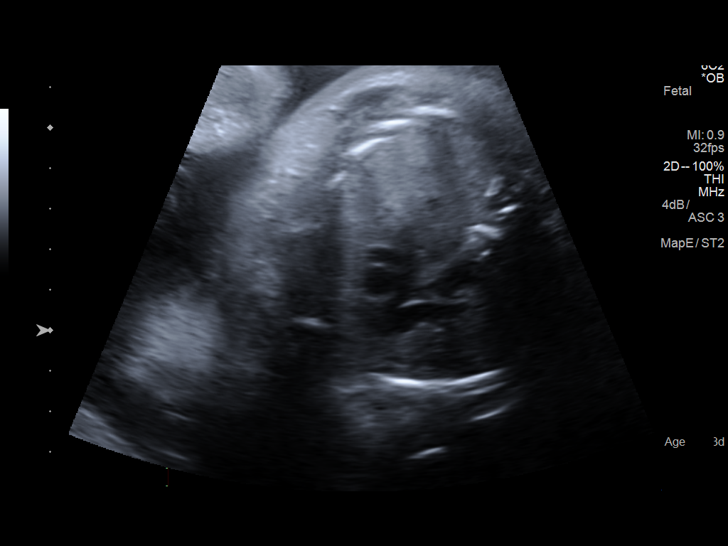
[im 74/77]
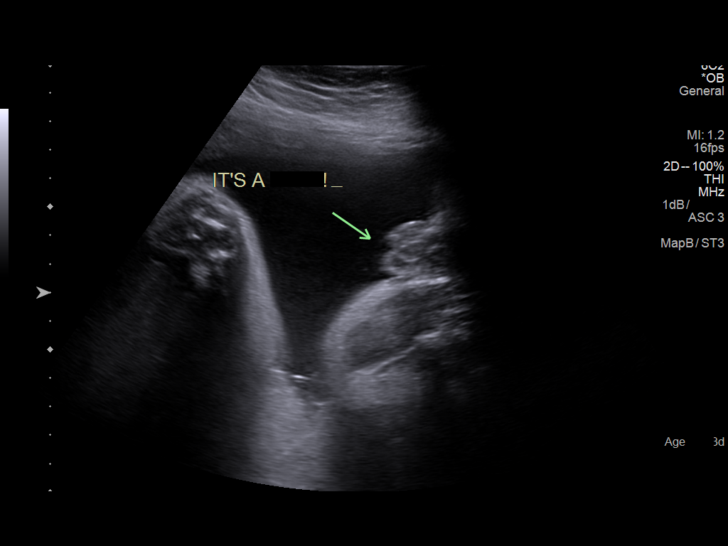

[13 of 28 positions shown; findings below may reference images not displayed]

FINDINGS: Number of Fetuses: 1

Heart Rate:  145 bpm

Movement: Yes

Presentation: Cephalic

Previa: No

Placental Location: Posterior

Amniotic Fluid (Subjective): Within normal limits

Amniotic Fluid (Objective):

Vertical pocket 6.0cm

AFI 15.5 cm (5%ile= 8.8 cm, 95%= 23.8 cm for 31 wks)

FETAL BIOMETRY

BPD:  7.8cm 31w 3d

HC:    29.8cm  33w   0d

AC:   30.0cm  34w   0d

FL:   6.1cm  31w   4d

Current Mean GA: 32w 3d              US EDC: 05/06/2017

Assigned GA:  31w 3d           Assigned EDC:  05/13/2017

Estimated Fetal Weight:  2,083g    85%ile

FETAL ANATOMY

Lateral Ventricles: Appears normal

Thalami/CSP: Appears normal

Posterior Fossa:  Appears normal

Nuchal Region: Not well visualized

Upper Lip: Appears normal

Spine: Appears normal

4 Chamber Heart on Left: Appears normal

LVOT: Appears normal

RVOT: Appears normal

Stomach on Left: Appears normal

3 Vessel Cord: Appears normal

Cord Insertion site: Appears normal

Kidneys: Appears normal

Bladder: Appears normal

Extremities: Appears normal

Technically difficult due to: Advanced gestational age

Maternal Findings:

Cervix:  4.2 cm transabdominally
IMPRESSION: Assigned gestational age is currently 32 weeks 3 days. Estimated
fetal weight currently at 85th percentile. Consider ultrasound
follow-up of fetal growth in 4 weeks.

Amniotic fluid volume within normal limits.

No fetal anomalies seen involving visualized anatomy noted above.

No evidence of placenta previa or abruption. Normal cervical length.

## 2019-05-04 ENCOUNTER — Other Ambulatory Visit: Payer: Self-pay

## 2019-05-04 ENCOUNTER — Encounter: Payer: Self-pay | Admitting: Emergency Medicine

## 2019-05-04 ENCOUNTER — Emergency Department: Payer: Self-pay

## 2019-05-04 ENCOUNTER — Emergency Department
Admission: EM | Admit: 2019-05-04 | Discharge: 2019-05-04 | Disposition: A | Discharge status: Home / Self Care | Payer: Self-pay | Attending: Emergency Medicine | Admitting: Emergency Medicine

## 2019-05-04 DIAGNOSIS — M940 Chondrocostal junction syndrome [Tietze]: Secondary | ICD-10-CM | POA: Insufficient documentation

## 2019-05-04 LAB — URINALYSIS, COMPLETE (UACMP) WITH MICROSCOPIC
Bacteria, UA: NONE SEEN
Bilirubin Urine: NEGATIVE
Glucose, UA: NEGATIVE mg/dL
Hgb urine dipstick: NEGATIVE
Ketones, ur: NEGATIVE mg/dL
Nitrite: NEGATIVE
Protein, ur: NEGATIVE mg/dL
Specific Gravity, Urine: 1.015 (ref 1.005–1.030)
pH: 6 (ref 5.0–8.0)

## 2019-05-04 LAB — POCT PREGNANCY, URINE: Preg Test, Ur: NEGATIVE

## 2019-05-04 MED ORDER — MELOXICAM 15 MG PO TABS: 15.0000 mg | ORAL_TABLET | Freq: Every day | ORAL | 0 refills | Status: DC

## 2019-05-04 MED ORDER — METHOCARBAMOL 500 MG PO TABS: 500.0000 mg | ORAL_TABLET | Freq: Four times a day (QID) | ORAL | 0 refills | Status: DC

## 2019-05-04 NOTE — ED Notes (Signed)
Pt otf for imaging 

## 2019-05-04 NOTE — ED Triage Notes (Signed)
Pt reports pain to the upper right side of her back for the last few weeks. Pt reports thoughts she had pulled a muscle but it is not getting better.

## 2019-05-04 NOTE — ED Provider Notes (Signed)
[facility] Emergency Department Provider Note  ____________________________________________  Time seen: Approximately 3:48 PM  I have reviewed the triage vital signs and the nursing notes.   HISTORY  Chief Complaint Back Pain    HPI Linda Choi is a 23 y.o. female who presents the emergency department complaining of right posterior lateral rib pain times several weeks to several months.  Patient reports that the pain is intermittent but she seems to experience it every day.  Patient states that he can either be 8 or a sharp pain depending on how she turns.  She states that it is reproducible with certain movements or palpation.  She has no frank chest pain, shortness of breath, abdominal pain, nausea vomiting, diarrhea or constipation.  No dysuria, polyuria, hematuria.  Symptoms only affect the right side.  No trauma preceding these symptoms.  Patient denies any other complaints.  Intermittent use of over-the-counter medications helped somewhat with the pain however it never fully alleviated all the symptoms.         Past Medical History:  Diagnosis Date  . Anemia   . Cervical incompetence   . Ovarian cyst   . Type A blood, Rh negative     Patient Active Problem List   Diagnosis Date Noted  . Normal labor and delivery 04/28/2017  . UTI (urinary tract infection) 04/26/2017  . Pregnancy 02/13/2017  . History of macrosomia in infant in prior pregnancy, currently pregnant 01/23/2017  . History of pregnancy loss in prior pregnancy, currently pregnant in first trimester 10/03/2016  . High risk pregnancy, antepartum 10/03/2016  . Rh negative state in antepartum period 10/03/2016    Past Surgical History:  Procedure Laterality Date  . NO PAST SURGERIES      Prior to Admission medications   Medication Sig Start Date End Date Taking? Authorizing Provider  meloxicam (MOBIC) 15 MG tablet Take 1 tablet (15 mg total) by mouth daily. 05/04/19    Iley Deignan, Delorise Royals, PA-C  methocarbamol (ROBAXIN) 500 MG tablet Take 1 tablet (500 mg total) by mouth 4 (four) times daily. 05/04/19   Ndia Sampath, Delorise Royals, PA-C    Allergies Patient has no known allergies.  Family History  Problem Relation Age of Onset  . Eclampsia Mother   . Breast cancer Neg Hx   . Ovarian cancer Neg Hx     Social History Social History   Tobacco Use  . Smoking status: Never Smoker  . Smokeless tobacco: Never Used  Substance Use Topics  . Alcohol use: No  . Drug use: No     Review of Systems  Constitutional: No fever/chills Eyes: No visual changes. No discharge ENT: No upper respiratory complaints. Cardiovascular: no chest pain. Respiratory: no cough. No SOB. Gastrointestinal: No abdominal pain.  No nausea, no vomiting.  No diarrhea.  No constipation. Genitourinary: Negative for dysuria. No hematuria Musculoskeletal: Positive for right-sided back/posterolateral rib pain Skin: Negative for rash, abrasions, lacerations, ecchymosis. Neurological: Negative for headaches, focal weakness or numbness. 10-point ROS otherwise negative.  ____________________________________________   PHYSICAL EXAM:  VITAL SIGNS: ED Triage Vitals [05/04/19 1445]  Enc Vitals Group     BP 130/62     Pulse Rate 90     Resp 20     Temp 98.1 F (36.7 C)     Temp Source Oral     SpO2 99 %     Weight 170 lb (77.1 kg)     Height 5\' 4"  (1.626 m)     Head  Circumference      Peak Flow      Pain Score 7     Pain Loc      Pain Edu?      Excl. in [place]?      Constitutional: Alert and oriented. Well appearing and in no acute distress. Eyes: Conjunctivae are normal. PERRL. EOMI. Head: Atraumatic. ENT:      Ears:       Nose: No congestion/rhinnorhea.      Mouth/Throat: Mucous membranes are moist.  Neck: No stridor.  No cervical spine tenderness to palpation. Hematological/Lymphatic/Immunilogical: No cervical lymphadenopathy. Cardiovascular: Normal rate, regular  rhythm. Normal S1 and S2.  Good peripheral circulation. Respiratory: Normal respiratory effort without tachypnea or retractions. Lungs CTAB. Good air entry to the bases with no decreased or absent breath sounds. Gastrointestinal: Bowel sounds 4 quadrants. Soft and nontender to palpation. No guarding or rigidity. No palpable masses. No distention. No CVA tenderness. Musculoskeletal: Full range of motion to all extremities. No gross deformities appreciated.  Visualization of the back and ribs reveals no visible abnormality.  Patient is tender to palpation along the posterior lateral ribs.  This palpation reproduces patient's pain.  This is located over ribs 6 through 10 right side, diffusely over the posterior lateral aspect.  No palpable abnormality.  No paradoxical chest wall movement.  No tenderness to palpation over the thoracic spine.  Examination of the cervical and lumbar spine is also unremarkable. Neurologic:  Normal speech and language. No gross focal neurologic deficits are appreciated.  Skin:  Skin is warm, dry and intact. No rash noted. Psychiatric: Mood and affect are normal. Speech and behavior are normal. Patient exhibits appropriate insight and judgement.   ____________________________________________   LABS (all labs ordered are listed, but only abnormal results are displayed)  Labs Reviewed  URINALYSIS, COMPLETE (UACMP) WITH MICROSCOPIC - Abnormal; Notable for the following components:      Result Value   Color, Urine YELLOW (*)    APPearance CLEAR (*)    Leukocytes,Ua SMALL (*)    All other components within normal limits  POC URINE PREG, ED  POCT PREGNANCY, URINE   ____________________________________________  EKG   ____________________________________________  RADIOLOGY I personally viewed and evaluated these images as part of my medical decision making, as well as reviewing the written report by the radiologist.  Dg Ribs Unilateral W/chest Right  Result Date:  05/04/2019 CLINICAL DATA:  Chronic pain EXAM: RIGHT RIBS AND CHEST - 3+ VIEW COMPARISON:  None. FINDINGS: Frontal chest as well as oblique and cone-down rib images were obtained. The lungs are clear. The heart size and pulmonary vascularity are normal. No adenopathy. No appreciable rib fracture.  No pneumothorax or pleural effusion. IMPRESSION: No evident rib fracture.  Lungs clear. Electronically Signed   By: Lowella Grip III M.D.   On: 05/04/2019 16:15    ____________________________________________    PROCEDURES  Procedure(s) performed:    Procedures    Medications - No data to display   ____________________________________________   INITIAL IMPRESSION / ASSESSMENT AND PLAN / ED COURSE  Pertinent labs & imaging results that were available during my care of the patient were reviewed by me and considered in my medical decision making (see chart for details).  Review of the [place] CSRS was performed in accordance of the [place] prior to dispensing any controlled drugs.  Clinical Course as of May 03 1720  Tue May 04, 2019  1603 Patient presented to emergency department complaining of right mid back/rib pain  times several weeks or months.  Pain is intermittent in nature but patient reports having pain every day.  Patient denied any substernal chest pain, no cardiac history.  Patient denied any palpitations or shortness of breath.  On exam, findings are consistent with musculoskeletal pain, likely costochondritis based on location and duration.  However given location patient will be evaluated with chest x-ray, urinalysis.  No indication for further labs or imaging at this time.   [JC]    Clinical Course User Index [JC] Irvan Tiedt, Delorise RoyalsJonathan D, PA-C          Patient's diagnosis is consistent with costochondritis.  Patient presented to the emergency department with ongoing right mid back/rib pain.  Patient's pain is reproducible with palpation and increases with movement.  This time  exam is reassuring.  Chest x-ray and urinalysis are also reassuring at this time.  At this time given chronicity as well as reproducible symptoms I suspect musculoskeletal involvement.  No further work-up at this time.  Patient will be treated with meloxicam and Robaxin.  Follow-up primary care as needed..  Patient is given ED precautions to return to the ED for any worsening or new symptoms.     ____________________________________________  FINAL CLINICAL IMPRESSION(S) / ED DIAGNOSES  Final diagnoses:  Costochondritis      NEW MEDICATIONS STARTED DURING THIS VISIT:  ED Discharge Orders         Ordered    meloxicam (MOBIC) 15 MG tablet  Daily     05/04/19 1719    methocarbamol (ROBAXIN) 500 MG tablet  4 times daily     05/04/19 1719              This chart was dictated using voice recognition software/Dragon. Despite best efforts to proofread, errors can occur which can change the meaning. Any change was purely unintentional.    Racheal PatchesCuthriell, Rosie Torrez D, PA-C 05/04/19 1721    Arnaldo NatalMalinda, Paul F, MD 05/04/19 2053

## 2019-05-04 NOTE — ED Notes (Signed)
See triage note  Presents with pain to right mid back for the past 2-3 weeks  Pain increases with movement  Denies any injury or fever

## 2019-06-28 ENCOUNTER — Ambulatory Visit: Payer: HRSA Program | Attending: Internal Medicine

## 2019-06-28 DIAGNOSIS — U071 COVID-19: Secondary | ICD-10-CM | POA: Insufficient documentation

## 2019-06-28 DIAGNOSIS — Z20828 Contact with and (suspected) exposure to other viral communicable diseases: Secondary | ICD-10-CM | POA: Diagnosis present

## 2019-06-28 DIAGNOSIS — Z20822 Contact with and (suspected) exposure to covid-19: Secondary | ICD-10-CM

## 2019-06-29 LAB — SPECIMEN STATUS REPORT

## 2019-06-29 LAB — NOVEL CORONAVIRUS, NAA: SARS-CoV-2, NAA: DETECTED — AB

## 2019-07-25 IMAGING — CT CT ABD-PELV W/ CM
2 of 4 series · 16 of 46 positions shown, 18 images · IV contrast (omnipaque)
Comparison: Pelvic ultrasound September 09, 2018

CLINICAL DATA: Lower abdominal pain, now localized to the RIGHT
lower quadrant. Vomiting.

EXAM:
CT ABDOMEN AND PELVIS WITH CONTRAST
TECHNIQUE: Multidetector CT imaging of the abdomen and pelvis was performed
using the standard protocol following bolus administration of
intravenous contrast.
CONTRAST:  100mL OMNIPAQUE IOHEXOL 300 MG/ML  SOLN

[Series 3: abdomen 5.0 · axial · 0.79mm/px · z∈[+760,+1165]mm · 13 of 93 slices shown, 15 images]
[im 6/93  soft-tissue]
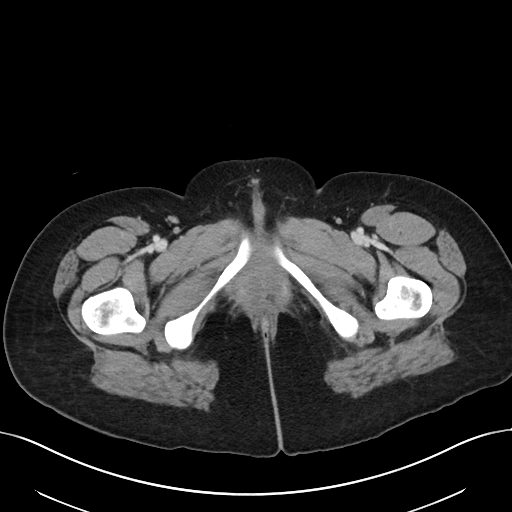
[im 6/93  bone]
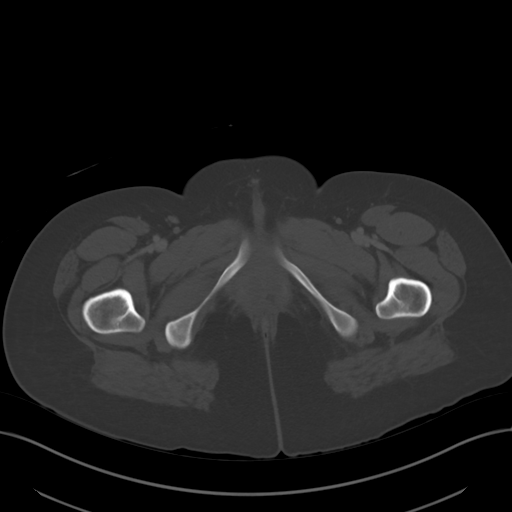
[im 11/93  soft-tissue]
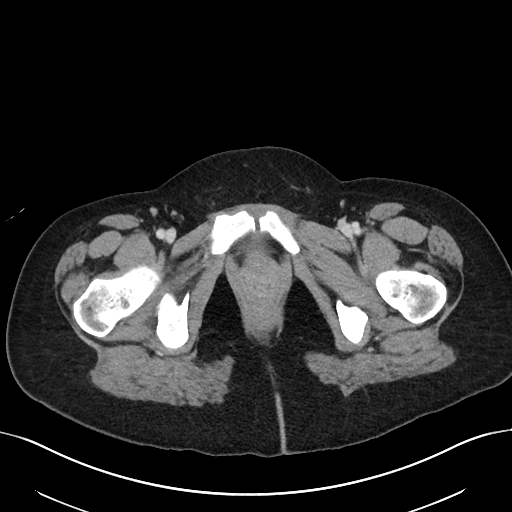
[im 22/93  soft-tissue]
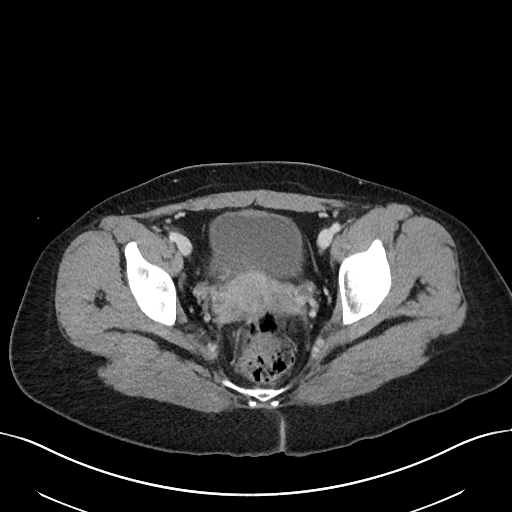
[im 28/93  soft-tissue]
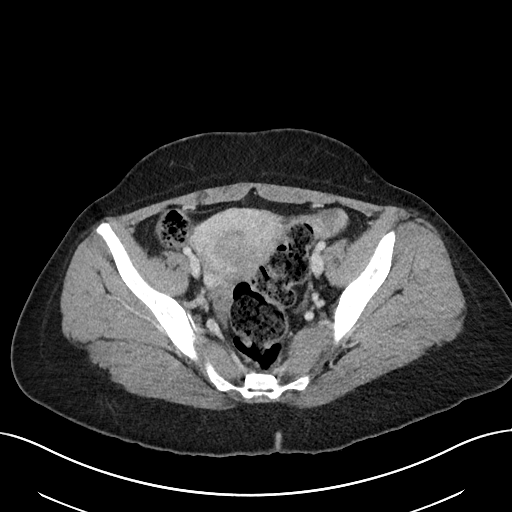
[im 33/93  soft-tissue]
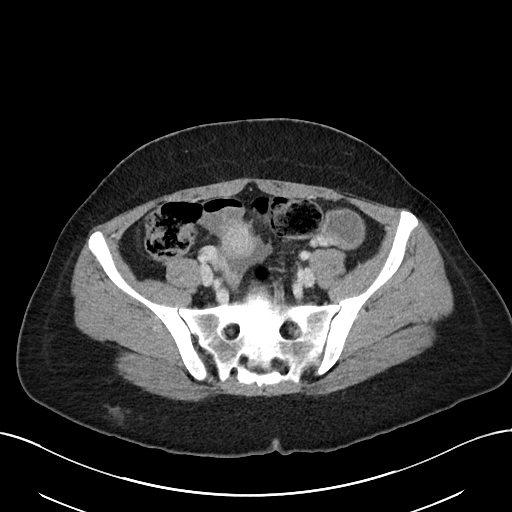
[im 38/93  soft-tissue]
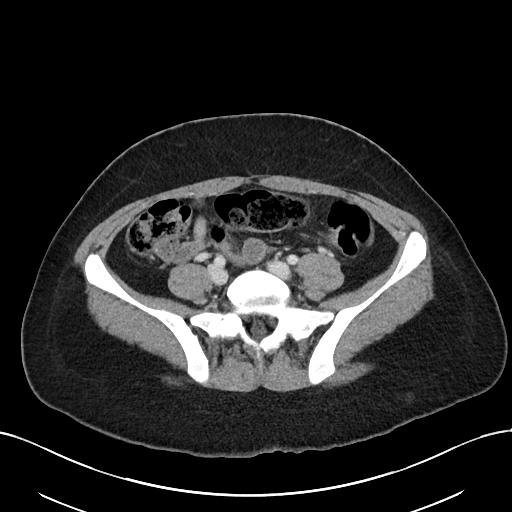
[im 49/93  soft-tissue]
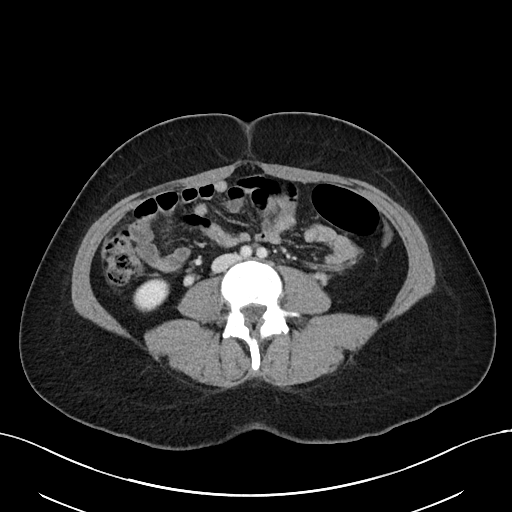
[im 55/93  soft-tissue]
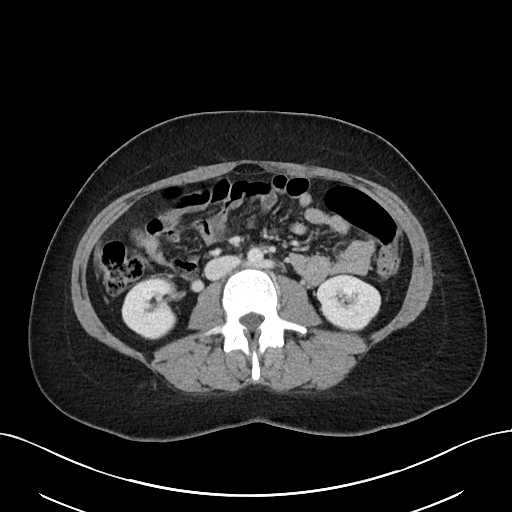
[im 60/93  soft-tissue]
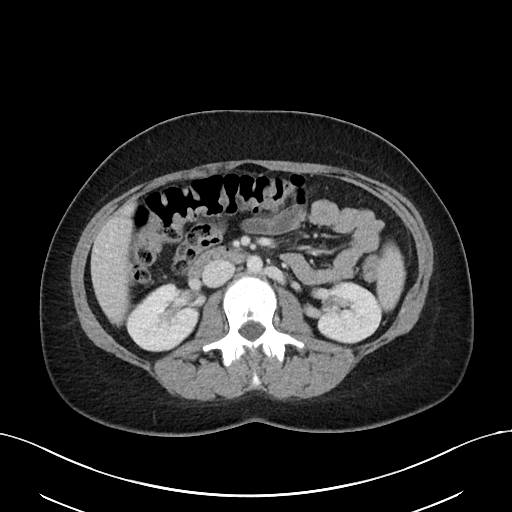
[im 60/93  bone]
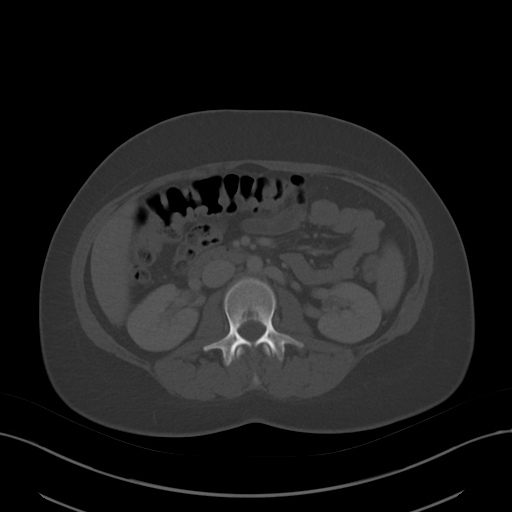
[im 65/93  soft-tissue]
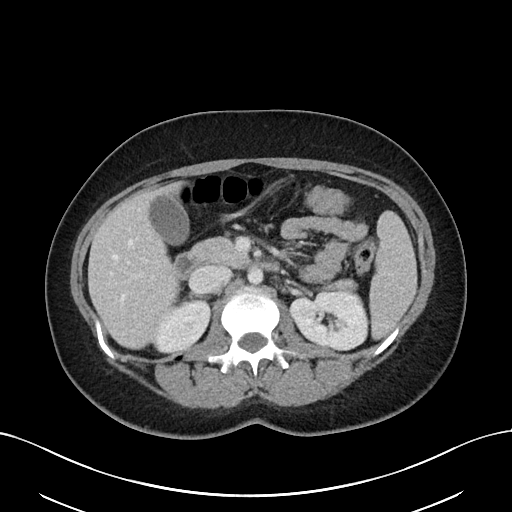
[im 71/93  soft-tissue]
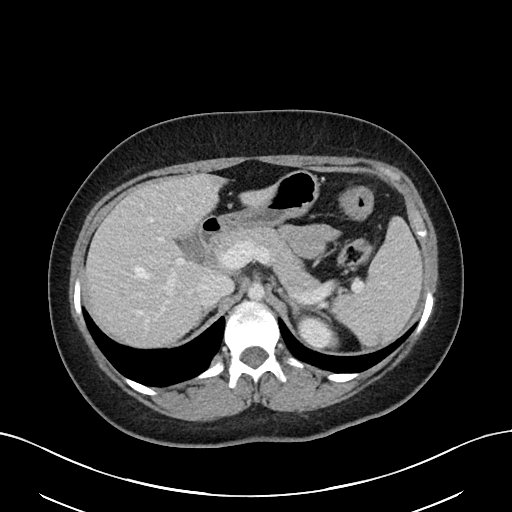
[im 82/93  soft-tissue]
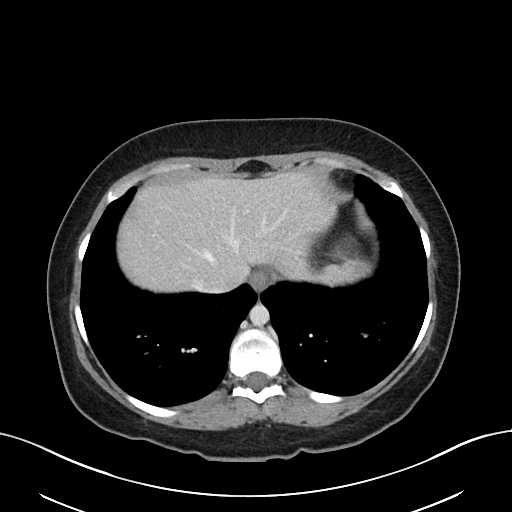
[im 87/93  soft-tissue]
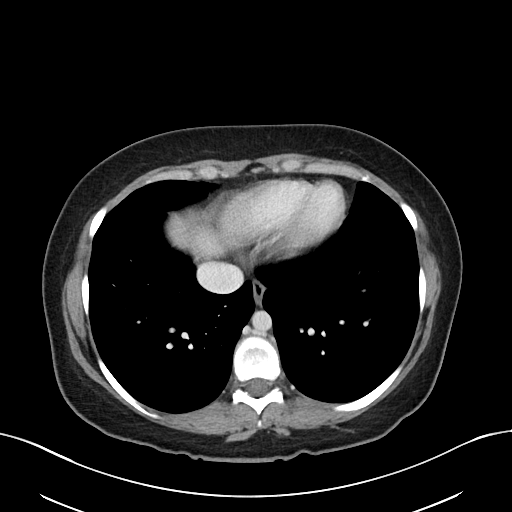

[Series 6: abdomen 3.0 mpr cor · coronal · 0.69mm/px · 3 of 83 slices shown]
[im 28/83  soft-tissue]
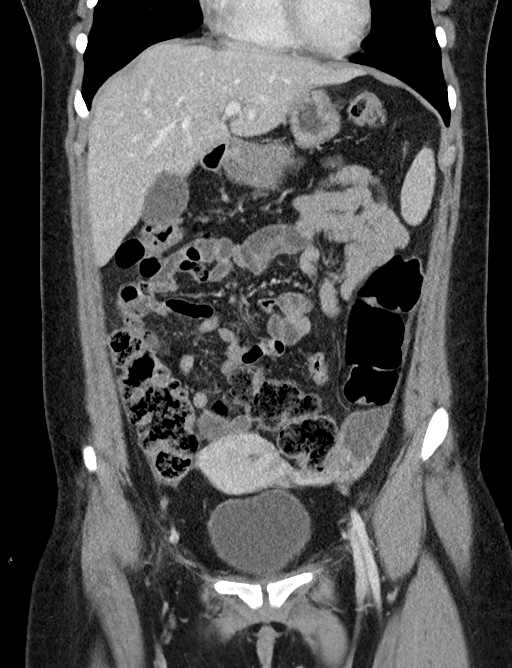
[im 37/83  soft-tissue]
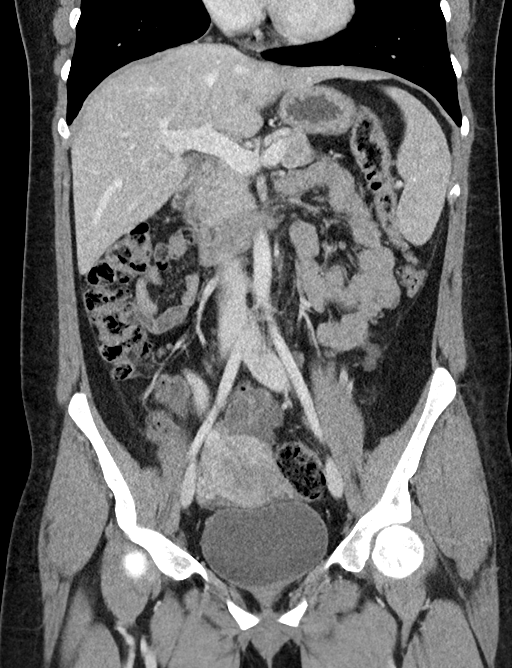
[im 46/83  soft-tissue]
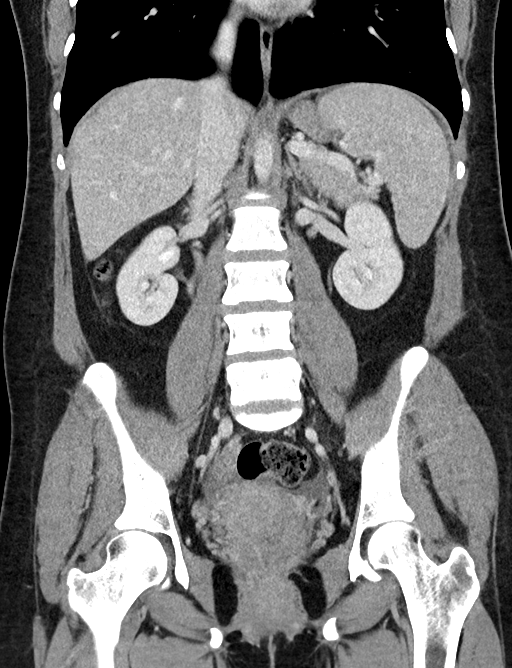

[16 of 46 positions shown; findings below may reference images not displayed]

FINDINGS: LOWER CHEST: Lung bases are clear. Included heart size is normal. No
pericardial effusion.

HEPATOBILIARY: Liver and gallbladder are normal.

PANCREAS: Normal.

SPLEEN: Subcapsular parenchymal linear hypodensity with trace
pericapsular free fluid.

ADRENALS/URINARY TRACT: Kidneys are orthotopic, demonstrating
symmetric enhancement. No nephrolithiasis, hydronephrosis or solid
renal masses. The unopacified ureters are normal in course and
caliber. Urinary bladder is partially distended and unremarkable.
Normal adrenal glands.

STOMACH/BOWEL: The stomach, small and large bowel are normal in
course and caliber without inflammatory changes. Normal appendix.

VASCULAR/LYMPHATIC: Aortoiliac vessels are normal in course and
caliber. No lymphadenopathy by CT size criteria.

REPRODUCTIVE: 4.2 cm LEFT paraovarian cyst better demonstrated on
today's ultrasound.

OTHER: Trace perihepatic free fluid. Small volume low-density free
fluid in the pelvis including RIGHT lower quadrant it has.

MUSCULOSKELETAL: Nonacute.  Thoracic Schmorl's nodes.
IMPRESSION: 1. Small volume pericapsular free fluid with cleft versus grade 1
laceration. This can be seen with trauma or Marta Patricia Stand.
Alternatively, this could reflect a cleft and free fluid tracking to
LEFT upper quadrant.
2. 4 cm complex LEFT ovarian cyst better demonstrated on today's
stealth ago ultrasound. Small volume free fluid in the pelvis,
possibly related to ruptured adnexal cyst.
3. Acute findings discussed with and reconfirmed by Dr.EURO
GUNNVANT on 09/09/2018 at [DATE].

## 2019-08-28 IMAGING — US US PELVIS COMPLETE
1 series · 13 of 25 positions shown · non-contrast
Comparison: Pelvic ultrasound; CT abdomen and pelvis September 09, 2018

CLINICAL DATA: Pelvic pain

EXAM:
TRANSABDOMINAL AND TRANSVAGINAL ULTRASOUND OF PELVIS
DOPPLER ULTRASOUND OF OVARIES
TECHNIQUE: Study was performed transabdominally to optimize pelvic field of
view evaluation and transvaginally to optimize internal visceral
architecture evaluation.
Color and duplex Doppler ultrasound was utilized to evaluate blood
flow to the ovaries.

[Series 1: us pelvis complete · 103 acquisitions, 13 frames shown]
[im 1/103]
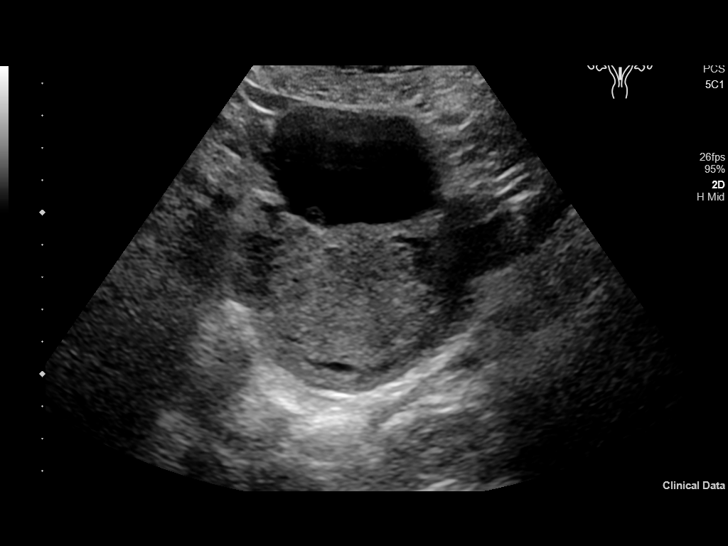
[im 9/103]
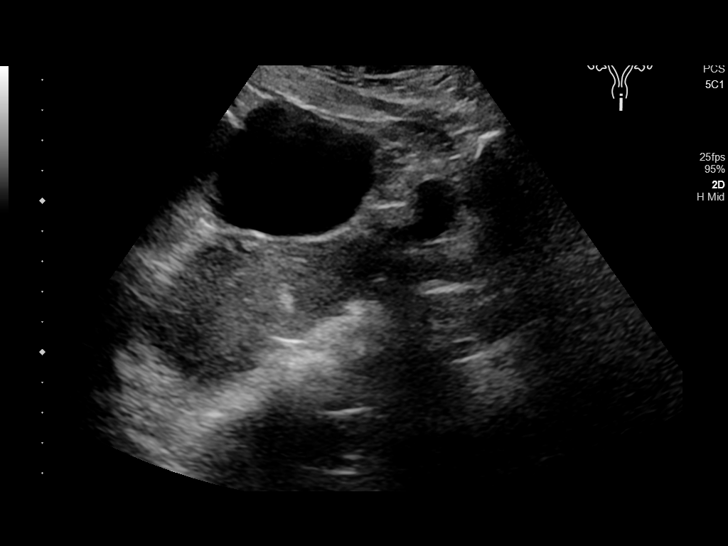
[im 18/103]
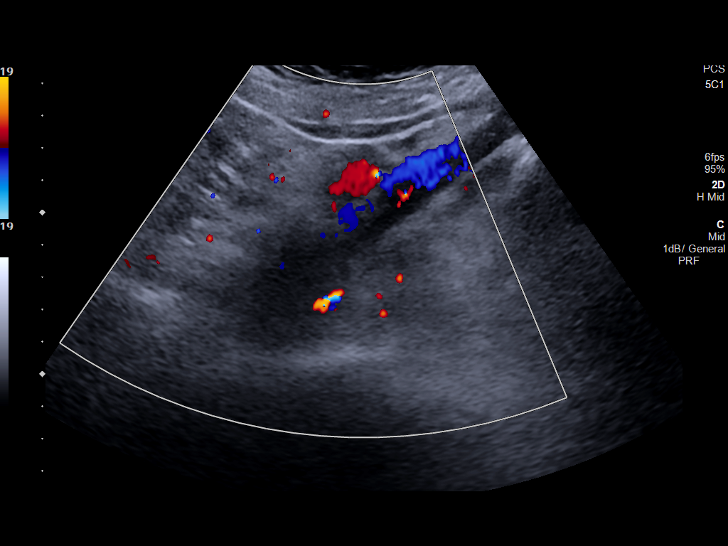
[im 26/103]
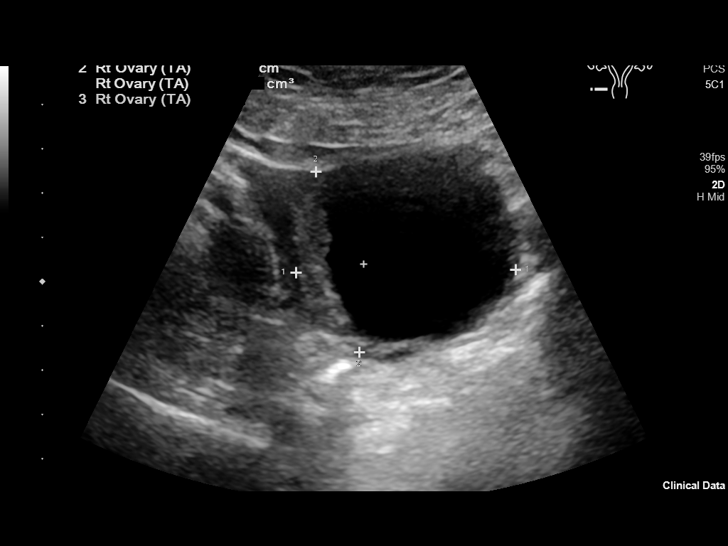
[im 35/103]
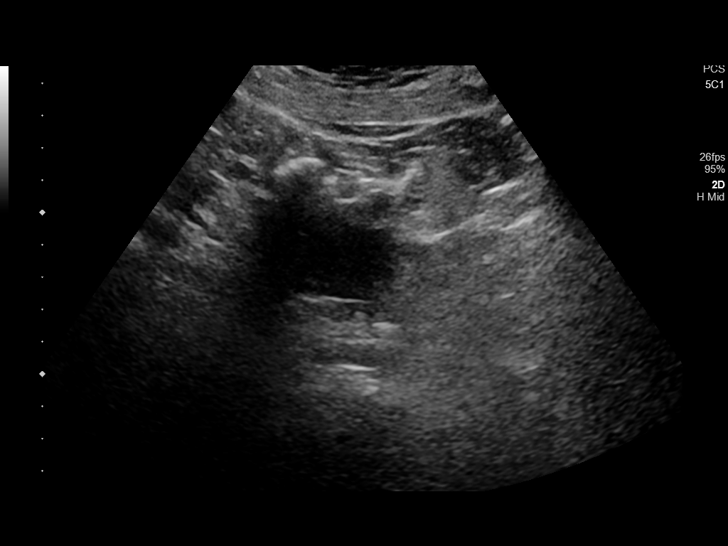
[im 43/103]
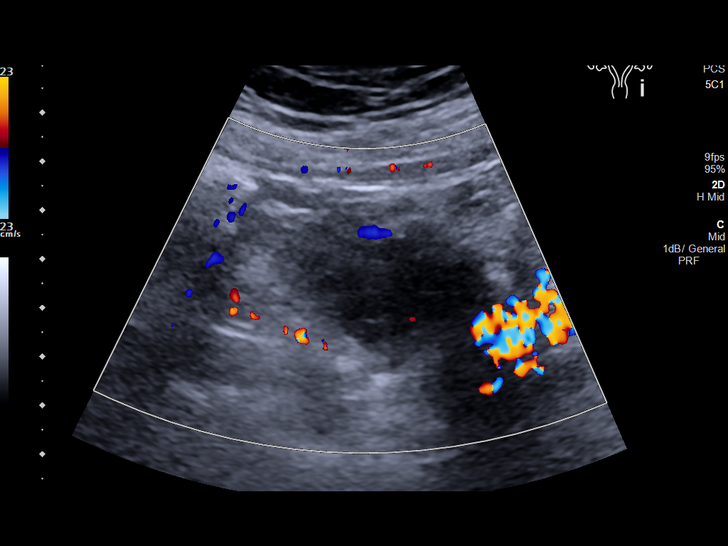
[im 52/103]
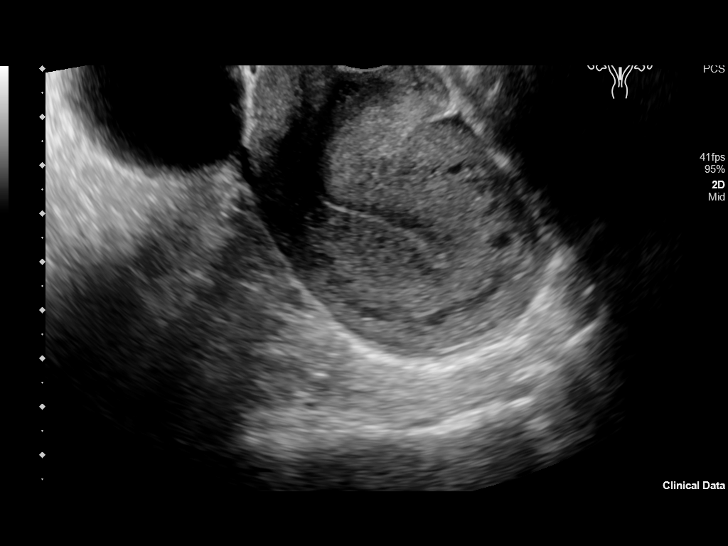
[im 60/103]
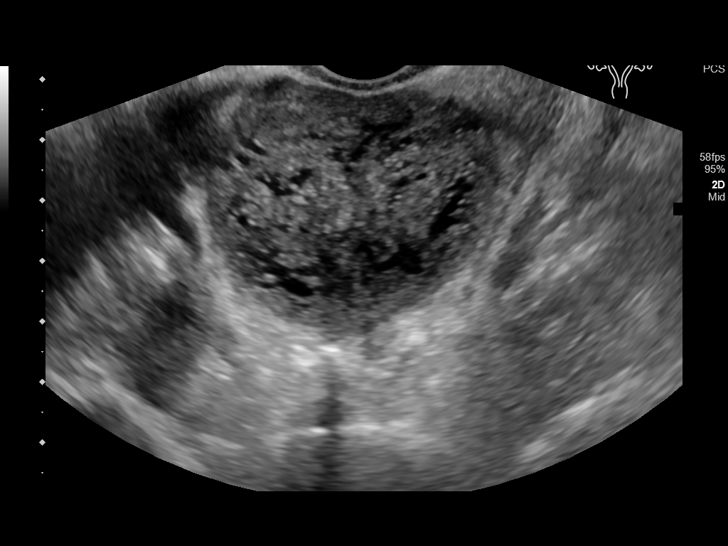
[im 69/103]
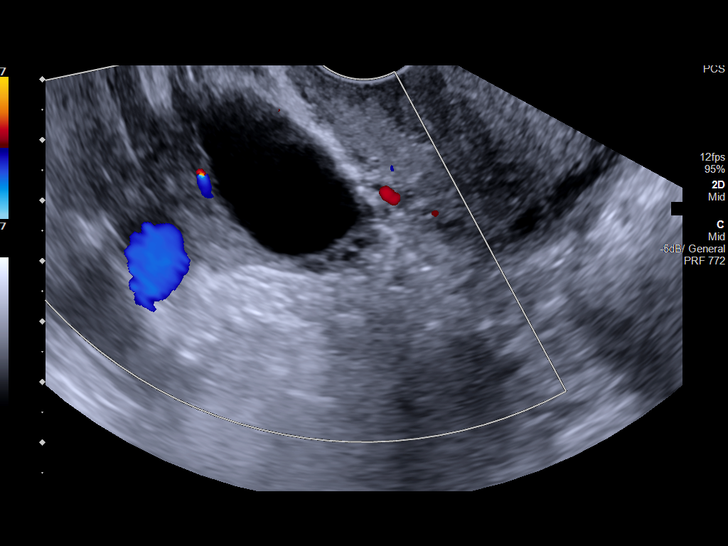
[im 77/103]
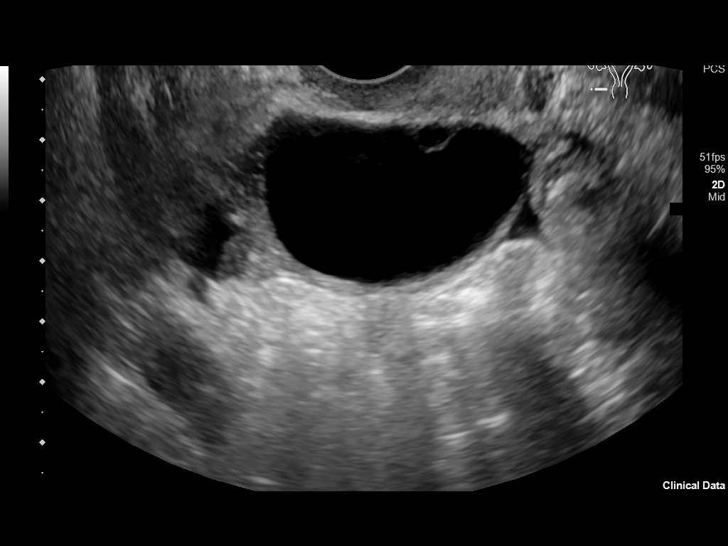
[im 86/103]
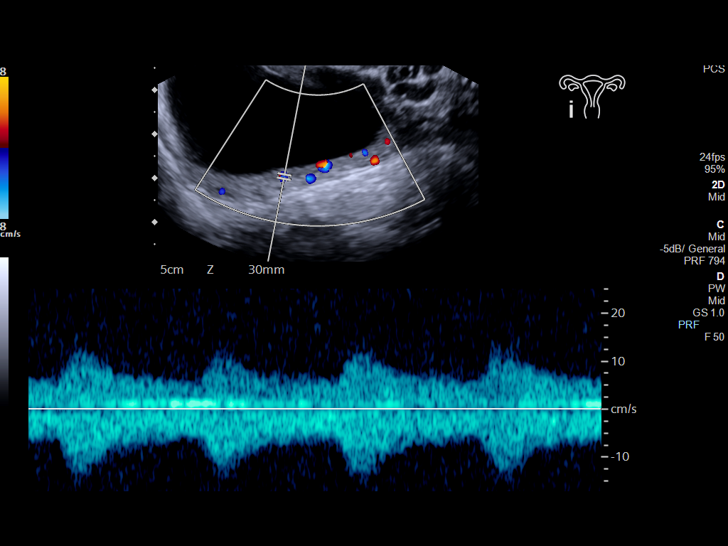
[im 94/103]
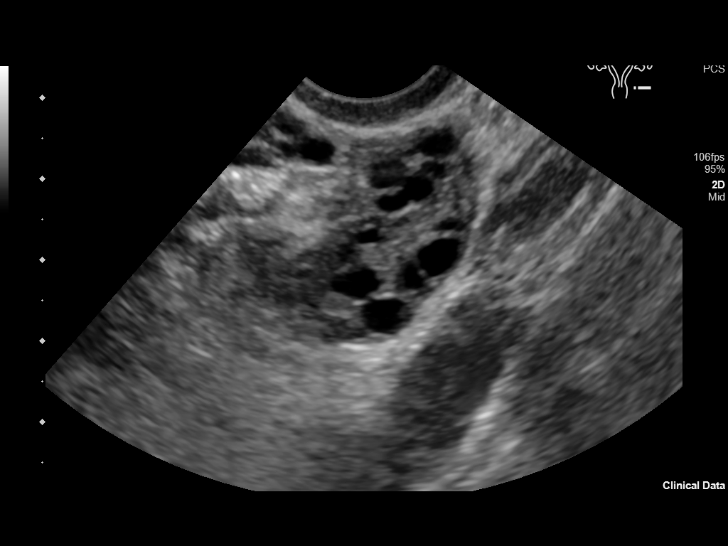
[im 103/103]
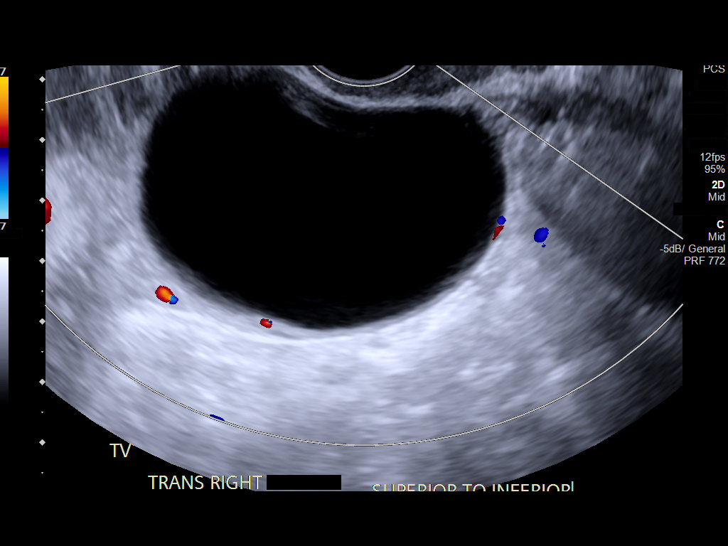

[13 of 25 positions shown; findings below may reference images not displayed]

FINDINGS: Uterus

Measurements: 8.2 x 4.7 x 6.3 cm = volume: 121.4 mL. No fibroids or
other mass visualized. Uterus is retroverted.

Endometrium

Thickness: 9 mm.  No focal abnormality visualized.

Right ovary

Measurements: 5.4 x 2.4 x 4.7 cm = volume: 31.9 mL. There is a cyst
arising from the right ovary measuring 5.5 x 3.9 x 5.8 cm. There is
minimal septation within this cyst, and slight posterior wall
thickening. A small amount of fluid immediately adjacent to this
cyst is noted.

Left ovary

Measurements: 3.7 x 1.5 x 1.8 cm = volume: 5.4 mL. Normal
appearance/no adnexal mass. Previous left-sided adnexal mass no
longer evident.

Pulsed Doppler evaluation of both ovaries demonstrates normal
low-resistance arterial and venous waveforms.

Other findings

Minimal fluid noted adjacent to right ovarian cyst. No other pelvic
fluid evident.
IMPRESSION: 1. Mildly complex cyst arising from right ovary measuring 5.5 x
x 5.8 cm. Suspect mildly hemorrhagic cyst. Short-interval follow up
ultrasound in 6-12 weeks is recommended, preferably during the week
following the patient's normal menses. Small amount of fluid
immediately adjacent to this cyst may indicate recent ovarian cyst
leakage.

2.  Resolution of left-sided adnexal mass.

3.  Doppler signal noted in each ovary without evident torsion.

4. Retroverted uterus. No intrauterine or endometrial lesion
evident.

## 2019-10-13 ENCOUNTER — Emergency Department
Admission: EM | Admit: 2019-10-13 | Discharge: 2019-10-13 | Disposition: A | Discharge status: Home / Self Care | Payer: Medicaid Other | Attending: Emergency Medicine | Admitting: Emergency Medicine

## 2019-10-13 ENCOUNTER — Encounter: Payer: Self-pay | Admitting: Emergency Medicine

## 2019-10-13 ENCOUNTER — Emergency Department: Payer: Medicaid Other

## 2019-10-13 ENCOUNTER — Other Ambulatory Visit: Payer: Self-pay

## 2019-10-13 DIAGNOSIS — R109 Unspecified abdominal pain: Secondary | ICD-10-CM | POA: Insufficient documentation

## 2019-10-13 DIAGNOSIS — R102 Pelvic and perineal pain: Secondary | ICD-10-CM

## 2019-10-13 DIAGNOSIS — Z3A00 Weeks of gestation of pregnancy not specified: Secondary | ICD-10-CM | POA: Insufficient documentation

## 2019-10-13 DIAGNOSIS — Z3491 Encounter for supervision of normal pregnancy, unspecified, first trimester: Secondary | ICD-10-CM

## 2019-10-13 DIAGNOSIS — O99891 Other specified diseases and conditions complicating pregnancy: Secondary | ICD-10-CM | POA: Diagnosis present

## 2019-10-13 LAB — COMPREHENSIVE METABOLIC PANEL
ALT: 24 U/L (ref 0–44)
AST: 24 U/L (ref 15–41)
Albumin: 3.5 g/dL (ref 3.5–5.0)
Alkaline Phosphatase: 52 U/L (ref 38–126)
Anion gap: 5 (ref 5–15)
BUN: 17 mg/dL (ref 6–20)
CO2: 22 mmol/L (ref 22–32)
Calcium: 8.2 mg/dL — ABNORMAL LOW (ref 8.9–10.3)
Chloride: 108 mmol/L (ref 98–111)
Creatinine, Ser: 0.62 mg/dL (ref 0.44–1.00)
GFR calc Af Amer: >60 mL/min (ref >60)
GFR calc non Af Amer: >60 mL/min (ref >60)
Glucose, Bld: 98 mg/dL (ref 70–99)
Potassium: 3.8 mmol/L (ref 3.5–5.1)
Sodium: 135 mmol/L (ref 135–145)
Total Bilirubin: 0.4 mg/dL (ref 0.3–1.2)
Total Protein: 6.6 g/dL (ref 6.5–8.1)

## 2019-10-13 LAB — CBC
HCT: 37.2 % (ref 36.0–46.0)
Hemoglobin: 12.2 g/dL (ref 12.0–15.0)
MCH: 28.2 pg (ref 26.0–34.0)
MCHC: 32.8 g/dL (ref 30.0–36.0)
MCV: 86.1 fL (ref 80.0–100.0)
Platelets: 170 10*3/uL (ref 150–400)
RBC: 4.32 MIL/uL (ref 3.87–5.11)
RDW: 12.2 % (ref 11.5–15.5)
WBC: 6.9 10*3/uL (ref 4.0–10.5)
nRBC: 0 % (ref 0.0–0.2)

## 2019-10-13 LAB — HCG, QUANTITATIVE, PREGNANCY: hCG, Beta Chain, Quant, S: 707 m[IU]/mL — ABNORMAL HIGH (ref <5)

## 2019-10-13 NOTE — ED Provider Notes (Addendum)
[facility] Emergency Department Provider Note       Time seen: ----------------------------------------- 7:32 AM on 10/13/2019 -----------------------------------------   I have reviewed the triage vital signs and the nursing notes.  HISTORY   Chief Complaint Abdominal Pain    HPI Linda Choi is a 24 y.o. female with a history of anemia, ovarian cyst, UTI who presents to the ED for abdominal cramping.  Patient describes mild abdominal cramping, states she is in early pregnancy but does not know how far along she is.  Denies any other complaints.  Past Medical History:  Diagnosis Date  . Anemia   . Cervical incompetence   . Ovarian cyst   . Type A blood, Rh negative     Patient Active Problem List   Diagnosis Date Noted  . Normal labor and delivery 04/28/2017  . UTI (urinary tract infection) 04/26/2017  . Pregnancy 02/13/2017  . History of macrosomia in infant in prior pregnancy, currently pregnant 01/23/2017  . History of pregnancy loss in prior pregnancy, currently pregnant in first trimester 10/03/2016  . High risk pregnancy, antepartum 10/03/2016  . Rh negative state in antepartum period 10/03/2016    Past Surgical History:  Procedure Laterality Date  . NO PAST SURGERIES      Allergies Patient has no known allergies.  Social History Social History   Tobacco Use  . Smoking status: Never Smoker  . Smokeless tobacco: Never Used  Substance Use Topics  . Alcohol use: No  . Drug use: No    Review of Systems Constitutional: Negative for fever. Cardiovascular: Negative for chest pain. Respiratory: Negative for shortness of breath. Gastrointestinal: Positive for abdominal pain Genitourinary: Negative for vaginal bleeding or discharge Musculoskeletal: Negative for back pain. Skin: Negative for rash. Neurological: Negative for headaches, focal weakness or numbness.  All systems negative/normal/unremarkable except as stated  in the HPI  ____________________________________________   PHYSICAL EXAM:  VITAL SIGNS: ED Triage Vitals  Enc Vitals Group     BP 10/13/19 0603 116/67     Pulse Rate 10/13/19 0603 88     Resp 10/13/19 0603 18     Temp 10/13/19 0603 98.2 F (36.8 C)     Temp Source 10/13/19 0603 Oral     SpO2 10/13/19 0603 100 %     Weight 10/13/19 0602 165 lb (74.8 kg)     Height 10/13/19 0602 5\' 4"  (1.626 m)     Head Circumference --      Peak Flow --      Pain Score 10/13/19 0602 4     Pain Loc --      Pain Edu? --      Excl. in GC? --     Constitutional: Alert and oriented. Well appearing and in no distress. Eyes: Conjunctivae are normal. Normal extraocular movements. Cardiovascular: Normal rate, regular rhythm. No murmurs, rubs, or gallops. Respiratory: Normal respiratory effort without tachypnea nor retractions. Breath sounds are clear and equal bilaterally. No wheezes/rales/rhonchi. Gastrointestinal: Soft and nontender. Normal bowel sounds Musculoskeletal: Nontender with normal range of motion in extremities. No lower extremity tenderness nor edema. Neurologic:  Normal speech and language. No gross focal neurologic deficits are appreciated.  Skin:  Skin is warm, dry and intact. No rash noted. Psychiatric: Mood and affect are normal. Speech and behavior are normal.  ____________________________________________  ED COURSE:  As part of my medical decision making, I reviewed the following data within the electronic MEDICAL RECORD NUMBER History obtained from family if available, nursing  notes, old chart and ekg, as well as notes from prior ED visits. Patient presented for abdominal pain in early pregnancy, we will assess with labs and imaging as indicated at this time.   Procedures  Linda Choi was evaluated in Emergency Department on 10/13/2019 for the symptoms described in the history of present illness. She was evaluated in the context of the global COVID-19 pandemic, which  necessitated consideration that the patient might be at risk for infection with the SARS-CoV-2 virus that causes COVID-19. Institutional protocols and algorithms that pertain to the evaluation of patients at risk for COVID-19 are in a state of rapid change based on information released by regulatory bodies including the CDC and federal and state organizations. These policies and algorithms were followed during the patient's care in the ED.  ____________________________________________   LABS (pertinent positives/negatives)  Labs Reviewed  COMPREHENSIVE METABOLIC PANEL - Abnormal; Notable for the following components:      Result Value   Calcium 8.2 (*)    All other components within normal limits  HCG, QUANTITATIVE, PREGNANCY - Abnormal; Notable for the following components:   hCG, Beta Chain, Quant, S 707 (*)    All other components within normal limits  CBC    RADIOLOGY Images were viewed by me  Pregnancy ultrasound IMPRESSION:  1. Probable early intrauterine gestational sac, but no yolk sac,  fetal pole, or cardiac activity yet visualized. Recommend follow-up  quantitative B-HCG levels and follow-up US in 14 days to assess  viability. This recommendation follows SRU consensus guidelines:  Diagnostic Criteria for Nonviable Pregnancy Early in the First  Trimester. [employer] Med 2013; 706:2376-28.  2. Small volume free fluid within the cul-de-sac.  ____________________________________________   DIFFERENTIAL DIAGNOSIS   Normal pregnancy, threatened miscarriage  FINAL ASSESSMENT AND PLAN  First trimester pregnancy   Plan: The patient had presented for some abdominal cramping in the first trimester. Patient's labs did indicate likely around 3-week pregnancy. Patient's imaging was unremarkable.  She is advised to follow-up in 2 days for repeat hCG if symptoms persist or 2 weeks for repeat ultrasound.   Linda Aly, MD    Note: This note was generated in part or whole  with voice recognition software. Voice recognition is usually quite accurate but there are transcription errors that can and very often do occur. I apologize for any typographical errors that were not detected and corrected.     Linda Newport, MD 10/13/19 3151    Linda Newport, MD 10/13/19 [phone]

## 2019-10-13 NOTE — ED Triage Notes (Signed)
Patient ambulatory to triage with steady gait, without difficulty or distress noted, mask in place; pt reports recent + home pregnancy test, having abd cramping; G4P2

## 2019-10-18 ENCOUNTER — Emergency Department
Admission: EM | Admit: 2019-10-18 | Discharge: 2019-10-18 | Disposition: A | Discharge status: Home / Self Care | Payer: Medicaid Other | Attending: Emergency Medicine | Admitting: Emergency Medicine

## 2019-10-18 ENCOUNTER — Other Ambulatory Visit: Payer: Self-pay

## 2019-10-18 ENCOUNTER — Encounter: Payer: Self-pay | Admitting: Emergency Medicine

## 2019-10-18 DIAGNOSIS — O26891 Other specified pregnancy related conditions, first trimester: Secondary | ICD-10-CM | POA: Diagnosis present

## 2019-10-18 DIAGNOSIS — Z3A01 Less than 8 weeks gestation of pregnancy: Secondary | ICD-10-CM | POA: Insufficient documentation

## 2019-10-18 DIAGNOSIS — Z3491 Encounter for supervision of normal pregnancy, unspecified, first trimester: Secondary | ICD-10-CM

## 2019-10-18 LAB — HCG, QUANTITATIVE, PREGNANCY: hCG, Beta Chain, Quant, S: 6409 m[IU]/mL — ABNORMAL HIGH (ref <5)

## 2019-10-18 NOTE — ED Provider Notes (Signed)
[facility] Emergency Department Provider Note   ____________________________________________   First MD Initiated Contact with Patient 10/18/19 1339     (approximate)  I have reviewed the triage vital signs and the nursing notes.   HISTORY  Chief Complaint Follow-up    HPI Linda Choi is a 24 y.o. female here for follow-up on her pregnancy level  Patient reports that she was seen in the ER about a week ago, she was asked to return to have her blood test retaken  She reports that she was told she was about [redacted] weeks pregnant her ultrasound her last visit.  Her symptoms have all resolved.  She denies any bleeding, vaginal discharge or abdominal pain.  She feels well.  She has noticed no nausea or vomiting.  No recent illness no fevers or chills  This is the patient's fourth pregnancy  She has a plan to follow-up with [facility], her first appointment is roughly the first week of May.  She has no ongoing symptoms.   Past Medical History:  Diagnosis Date  . Anemia   . Cervical incompetence   . Ovarian cyst   . Type A blood, Rh negative     Patient Active Problem List   Diagnosis Date Noted  . Normal labor and delivery 04/28/2017  . UTI (urinary tract infection) 04/26/2017  . Pregnancy 02/13/2017  . History of macrosomia in infant in prior pregnancy, currently pregnant 01/23/2017  . History of pregnancy loss in prior pregnancy, currently pregnant in first trimester 10/03/2016  . High risk pregnancy, antepartum 10/03/2016  . Rh negative state in antepartum period 10/03/2016    Past Surgical History:  Procedure Laterality Date  . NO PAST SURGERIES      Prior to Admission medications   Medication Sig Start Date End Date Taking? Authorizing Provider  meloxicam (MOBIC) 15 MG tablet Take 1 tablet (15 mg total) by mouth daily. 05/04/19   Cuthriell, Charline Bills, PA-C  methocarbamol (ROBAXIN) 500 MG tablet Take 1 tablet (500 mg total)  by mouth 4 (four) times daily. 05/04/19   Cuthriell, Charline Bills, PA-C    Allergies Patient has no known allergies.  Family History  Problem Relation Age of Onset  . Eclampsia Mother   . Breast cancer Neg Hx   . Ovarian cancer Neg Hx     Social History Social History   Tobacco Use  . Smoking status: Never Smoker  . Smokeless tobacco: Never Used  Substance Use Topics  . Alcohol use: No  . Drug use: No    Review of Systems Constitutional: No fever/chills Eyes: No visual changes.   Cardiovascular: Denies chest pain. Respiratory: Denies shortness of breath. Gastrointestinal: No abdominal pain.   Genitourinary: Negative for dysuria. Musculoskeletal: Standing on her feet at work will cause her to have some lower back discomfort Skin: Negative for rash. Neurological: Negative for weakness or feeling ill    ____________________________________________   PHYSICAL EXAM:  VITAL SIGNS: ED Triage Vitals  Enc Vitals Group     BP 10/18/19 1223 117/64     Pulse Rate 10/18/19 1223 96     Resp 10/18/19 1223 16     Temp 10/18/19 1223 98.4 F (36.9 C)     Temp Source 10/18/19 1223 Oral     SpO2 10/18/19 1223 100 %     Weight 10/18/19 1222 160 lb (72.6 kg)     Height 10/18/19 1222 5\' 4"  (1.626 m)     Head Circumference --  Peak Flow --      Pain Score 10/18/19 1237 0     Pain Loc --      Pain Edu? --      Excl. in GC? --     Constitutional: Alert and oriented. Well appearing and in no acute distress. Eyes: Conjunctivae are normal. Head: Atraumatic. Neck: No stridor.  Cardiovascular: Normal rate, regular rhythm. Grossly normal heart sounds.  Good peripheral circulation. Respiratory: Normal respiratory effort.  No retractions. Lungs CTAB. Gastrointestinal: Soft and nontender and not obviously gravid by exam. No distention. Musculoskeletal: No lower extremity tenderness nor edema. Neurologic:  Normal speech and language. No gross focal neurologic deficits are  appreciated.  Skin:  Skin is warm, dry and intact. No rash noted. Psychiatric: Mood and affect are normal. Speech and behavior are normal.  ____________________________________________   LABS (all labs ordered are listed, but only abnormal results are displayed)  Labs Reviewed  HCG, QUANTITATIVE, PREGNANCY - Abnormal; Notable for the following components:      Result Value   hCG, Beta Chain, Quant, S 6,409 (*)    All other components within normal limits   ____________________________________________  EKG   ____________________________________________  RADIOLOGY   ____________________________________________   PROCEDURES  Procedure(s) performed: None  Procedures  Critical Care performed: No  ____________________________________________   INITIAL IMPRESSION / ASSESSMENT AND PLAN / ED COURSE  Pertinent labs & imaging results that were available during my care of the patient were reviewed by me and considered in my medical decision making (see chart for details).   Prior hCG ~ 700  hCG today 6409  Patient denies any active abdominal or gynecologic symptoms.  Reports she is feeling well.  Very reassuring nontoxic examination.  She has plan to see [facility] first week of May.  I think this seems very appropriate.  She is had no symptoms or signs of suggest miscarriage, bleeding, discharge or infection.  She denies any associated symptoms at this time, and her hCG today is 6409 appears to be reassuring.  Discussed with the patient, she will follow-up with [facility].  Reviewed careful return precautions  Return precautions and treatment recommendations and follow-up discussed with the patient who is agreeable with the plan.         ____________________________________________   FINAL CLINICAL IMPRESSION(S) / ED DIAGNOSES  Final diagnoses:  First trimester pregnancy        Note:  This document was prepared using Dragon voice recognition  software and may include unintentional dictation errors       Sharyn Creamer, MD 10/18/19 1420

## 2019-10-18 NOTE — ED Notes (Signed)
Pt verbalizes understanding of d/c instructions and follow up. 

## 2019-10-18 NOTE — ED Triage Notes (Signed)
Arrives for repeat [facility].  Denies any complaint.    AAOx3.  Skin warm and dry. NAD.  G4 P2

## 2019-10-18 NOTE — ED Notes (Signed)
Pt reports was seen here for a possible pregnancy and had labs done. Pt was instructed to come back and have her beta rechecked to see if the numbers were increasing. Pt denies any new concerns.

## 2019-11-04 ENCOUNTER — Other Ambulatory Visit: Payer: Self-pay

## 2019-11-04 ENCOUNTER — Emergency Department
Admission: EM | Admit: 2019-11-04 | Discharge: 2019-11-05 | Disposition: A | Discharge status: Home / Self Care | Payer: Medicaid Other | Attending: Emergency Medicine | Admitting: Emergency Medicine

## 2019-11-04 DIAGNOSIS — E162 Hypoglycemia, unspecified: Secondary | ICD-10-CM

## 2019-11-04 DIAGNOSIS — O99810 Abnormal glucose complicating pregnancy: Secondary | ICD-10-CM | POA: Diagnosis not present

## 2019-11-04 DIAGNOSIS — Z3A01 Less than 8 weeks gestation of pregnancy: Secondary | ICD-10-CM | POA: Diagnosis not present

## 2019-11-04 DIAGNOSIS — R42 Dizziness and giddiness: Secondary | ICD-10-CM | POA: Diagnosis not present

## 2019-11-04 DIAGNOSIS — O2691 Pregnancy related conditions, unspecified, first trimester: Secondary | ICD-10-CM | POA: Diagnosis present

## 2019-11-04 LAB — URINALYSIS, COMPLETE (UACMP) WITH MICROSCOPIC
Bacteria, UA: NONE SEEN
Bilirubin Urine: NEGATIVE
Glucose, UA: NEGATIVE mg/dL
Hgb urine dipstick: NEGATIVE
Ketones, ur: NEGATIVE mg/dL
Nitrite: NEGATIVE
Protein, ur: NEGATIVE mg/dL
Specific Gravity, Urine: 1.029 (ref 1.005–1.030)
pH: 5 (ref 5.0–8.0)

## 2019-11-04 LAB — BASIC METABOLIC PANEL
Anion gap: 4 — ABNORMAL LOW (ref 5–15)
BUN: 15 mg/dL (ref 6–20)
CO2: 27 mmol/L (ref 22–32)
Calcium: 9.2 mg/dL (ref 8.9–10.3)
Chloride: 106 mmol/L (ref 98–111)
Creatinine, Ser: 0.53 mg/dL (ref 0.44–1.00)
GFR calc Af Amer: >60 mL/min (ref >60)
GFR calc non Af Amer: >60 mL/min (ref >60)
Glucose, Bld: 92 mg/dL (ref 70–99)
Potassium: 4.2 mmol/L (ref 3.5–5.1)
Sodium: 137 mmol/L (ref 135–145)

## 2019-11-04 LAB — CBC
HCT: 35.2 % — ABNORMAL LOW (ref 36.0–46.0)
Hemoglobin: 12.2 g/dL (ref 12.0–15.0)
MCH: 28.4 pg (ref 26.0–34.0)
MCHC: 34.7 g/dL (ref 30.0–36.0)
MCV: 82.1 fL (ref 80.0–100.0)
Platelets: 186 10*3/uL (ref 150–400)
RBC: 4.29 MIL/uL (ref 3.87–5.11)
RDW: 12.2 % (ref 11.5–15.5)
WBC: 6.1 10*3/uL (ref 4.0–10.5)
nRBC: 0 % (ref 0.0–0.2)

## 2019-11-04 LAB — GLUCOSE, CAPILLARY: Glucose-Capillary: 71 mg/dL (ref 70–99)

## 2019-11-04 NOTE — ED Triage Notes (Signed)
Pt arrives to ED via POV from home with c/o dizziness since 2pm today. Pt reports [redacted] weeks pregnant based on blood work performed on her last visit. No pregnancy related c/o's. Pt reports no prenatal care at this time; 1st appointment is May 3rd. Pt denies CP or SHOB; no c/o's N/V/D or fever. Pt is A&O, in NAD; RR even, regular, and unlabored.

## 2019-11-04 NOTE — ED Provider Notes (Signed)
[facility] Emergency Department Provider Note  ____________________________________________   First MD Initiated Contact with Patient 11/04/19 2352     (approximate)  I have reviewed the triage vital signs and the nursing notes.   HISTORY  Chief Complaint Dizziness    HPI Linda Choi is a 24 y.o. female G4, P3 approximately [redacted] weeks pregnant with below list of previous medical conditions  presents to the emergency department secondary to episode this afternoon of feeling shaky and "felt like I was going to pass out".  Patient states that she has had episodes like this in the past when she had low glucose.  Patient states she ate however after doing so she still felt shaky.  Patient states that symptoms are improved at this time however she still feels a bit shaky.  Patient denies any headache no shortness of breath no chest pain.  Patient denies any heart palpitations.          Past Medical History:  Diagnosis Date  . Anemia   . Cervical incompetence   . Ovarian cyst   . Type A blood, Rh negative     Patient Active Problem List   Diagnosis Date Noted  . Normal labor and delivery 04/28/2017  . UTI (urinary tract infection) 04/26/2017  . Pregnancy 02/13/2017  . History of macrosomia in infant in prior pregnancy, currently pregnant 01/23/2017  . History of pregnancy loss in prior pregnancy, currently pregnant in first trimester 10/03/2016  . High risk pregnancy, antepartum 10/03/2016  . Rh negative state in antepartum period 10/03/2016    Past Surgical History:  Procedure Laterality Date  . NO PAST SURGERIES      Prior to Admission medications   Medication Sig Start Date End Date Taking? Authorizing Provider  meloxicam (MOBIC) 15 MG tablet Take 1 tablet (15 mg total) by mouth daily. 05/04/19   Cuthriell, Charline Bills, PA-C  methocarbamol (ROBAXIN) 500 MG tablet Take 1 tablet (500 mg total) by mouth 4 (four) times daily. 05/04/19    Cuthriell, Charline Bills, PA-C    Allergies Patient has no known allergies.  Family History  Problem Relation Age of Onset  . Eclampsia Mother   . Breast cancer Neg Hx   . Ovarian cancer Neg Hx     Social History Social History   Tobacco Use  . Smoking status: Never Smoker  . Smokeless tobacco: Never Used  Substance Use Topics  . Alcohol use: No  . Drug use: No    Review of Systems Constitutional: No fever/chills Eyes: No visual changes. ENT: No sore throat. Cardiovascular: Denies chest pain. Respiratory: Denies shortness of breath. Gastrointestinal: No abdominal pain.  No nausea, no vomiting.  No diarrhea.  No constipation. Genitourinary: Negative for dysuria. Musculoskeletal: Negative for neck pain.  Negative for back pain. Integumentary: Negative for rash. Neurological: Negative for headaches, focal weakness or numbness.  ____________________________________________   PHYSICAL EXAM:  VITAL SIGNS: ED Triage Vitals  Enc Vitals Group     BP 11/04/19 1925 120/72     Pulse Rate 11/04/19 1925 84     Resp 11/04/19 1925 16     Temp 11/04/19 1925 98.1 F (36.7 C)     Temp Source 11/04/19 1925 Oral     SpO2 11/04/19 1925 100 %     Weight 11/04/19 1923 72.6 kg (160 lb)     Height 11/04/19 1923 1.626 m (5\' 4" )     Head Circumference --      Peak Flow --  Pain Score 11/04/19 1923 0     Pain Loc --      Pain Edu? --      Excl. in GC? --     Constitutional: Alert and oriented.  Eyes: Conjunctivae are normal.  Mouth/Throat: Patient is wearing a mask. Neck: No stridor.  No meningeal signs.   Cardiovascular: Normal rate, regular rhythm. Good peripheral circulation. Grossly normal heart sounds. Respiratory: Normal respiratory effort.  No retractions. Gastrointestinal: Soft and nontender. No distention.  Musculoskeletal: No lower extremity tenderness nor edema. No gross deformities of extremities. Neurologic:  Normal speech and language. No gross focal neurologic  deficits are appreciated.  Skin:  Skin is warm, dry and intact. Psychiatric: Mood and affect are normal. Speech and behavior are normal.  ____________________________________________   LABS (all labs ordered are listed, but only abnormal results are displayed)  Labs Reviewed  BASIC METABOLIC PANEL - Abnormal; Notable for the following components:      Result Value   Anion gap 4 (*)    All other components within normal limits  CBC - Abnormal; Notable for the following components:   HCT 35.2 (*)    All other components within normal limits  URINALYSIS, COMPLETE (UACMP) WITH MICROSCOPIC - Abnormal; Notable for the following components:   Color, Urine YELLOW (*)    APPearance CLOUDY (*)    Leukocytes,Ua LARGE (*)    All other components within normal limits  GLUCOSE, CAPILLARY   _________________  Procedures   ____________________________________________   INITIAL IMPRESSION / MDM / ASSESSMENT AND PLAN / ED COURSE  As part of my medical decision making, I reviewed the following data within the electronic MEDICAL RECORD NUMBER   24 year old [redacted] weeks pregnant female presenting with above-stated history and physical exam a differential diagnosis including but not limited to hypoglycemia.  Patient's glucose on blood work at 730 was 92 current glucose of 71.  Patient was given something to eat and drink here in the emergency department complete resolution of symptoms. ____________________________________________  FINAL CLINICAL IMPRESSION(S) / ED DIAGNOSES  Final diagnoses:  Hypoglycemia     MEDICATIONS GIVEN DURING THIS VISIT:  Medications - No data to display   ED Discharge Orders    None      *Please note:  [name] was evaluated in Emergency Department on 11/04/2019 for the symptoms described in the history of present illness. She was evaluated in the context of the global COVID-19 pandemic, which necessitated consideration that the patient might be at risk for  infection with the SARS-CoV-2 virus that causes COVID-19. Institutional protocols and algorithms that pertain to the evaluation of patients at risk for COVID-19 are in a state of rapid change based on information released by regulatory bodies including the CDC and federal and state organizations. These policies and algorithms were followed during the patient's care in the ED.  Some ED evaluations and interventions may be delayed as a result of limited staffing during the pandemic.*  Note:  This document was prepared using Dragon voice recognition software and may include unintentional dictation errors.   Darci Current, MD 11/05/19 [phone]

## 2019-11-05 NOTE — ED Notes (Signed)
Patient was given food and beverage

## 2019-11-08 ENCOUNTER — Other Ambulatory Visit (HOSPITAL_COMMUNITY)
Admission: RE | Admit: 2019-11-08 | Discharge: 2019-11-08 | Disposition: A | Discharge status: Home / Self Care | Payer: Medicaid Other | Source: Ambulatory Visit | Attending: Obstetrics & Gynecology | Admitting: Obstetrics & Gynecology

## 2019-11-08 ENCOUNTER — Encounter: Payer: Self-pay | Admitting: Obstetrics & Gynecology

## 2019-11-08 ENCOUNTER — Ambulatory Visit (INDEPENDENT_AMBULATORY_CARE_PROVIDER_SITE_OTHER): Payer: Medicaid Other | Admitting: Obstetrics & Gynecology

## 2019-11-08 ENCOUNTER — Other Ambulatory Visit: Payer: Self-pay

## 2019-11-08 VITALS — BP 100/60 | Wt 175.0 lb

## 2019-11-08 DIAGNOSIS — Z369 Encounter for antenatal screening, unspecified: Secondary | ICD-10-CM

## 2019-11-08 DIAGNOSIS — Z3491 Encounter for supervision of normal pregnancy, unspecified, first trimester: Secondary | ICD-10-CM | POA: Insufficient documentation

## 2019-11-08 DIAGNOSIS — Z124 Encounter for screening for malignant neoplasm of cervix: Secondary | ICD-10-CM | POA: Diagnosis present

## 2019-11-08 DIAGNOSIS — O0991 Supervision of high risk pregnancy, unspecified, first trimester: Secondary | ICD-10-CM

## 2019-11-08 DIAGNOSIS — O09291 Supervision of pregnancy with other poor reproductive or obstetric history, first trimester: Secondary | ICD-10-CM

## 2019-11-08 DIAGNOSIS — Z3A09 9 weeks gestation of pregnancy: Secondary | ICD-10-CM

## 2019-11-08 DIAGNOSIS — Z1379 Encounter for other screening for genetic and chromosomal anomalies: Secondary | ICD-10-CM

## 2019-11-08 MED ORDER — PRENATE PIXIE 10-0.6-0.4-200 MG PO CAPS: 1.0000 | ORAL_CAPSULE | Freq: Every day | ORAL | 11 refills | Status: DC

## 2019-11-08 MED ORDER — DOXYLAMINE-PYRIDOXINE 10-10 MG PO TBEC: 2.0000 | DELAYED_RELEASE_TABLET | Freq: Every day | ORAL | 5 refills | Status: DC

## 2019-11-08 NOTE — Patient Instructions (Signed)
First Trimester of Pregnancy The first trimester of pregnancy is from week 1 until the end of week 13 (months 1 through 3). A week after a sperm fertilizes an egg, the egg will implant on the wall of the uterus. This embryo will begin to develop into a baby. Genes from you and your partner will form the baby. The female genes will determine whether the baby will be a boy or a girl. At 6-8 weeks, the eyes and face will be formed, and the heartbeat can be seen on ultrasound. At the end of 12 weeks, all the baby's organs will be formed. Now that you are pregnant, you will want to do everything you can to have a healthy baby. Two of the most important things are to get good prenatal care and to follow your health care provider's instructions. Prenatal care is all the medical care you receive before the baby's birth. This care will help prevent, find, and treat any problems during the pregnancy and childbirth. Body changes during your first trimester Your body goes through many changes during pregnancy. The changes vary from woman to woman.  You may gain or lose a couple of pounds at first.  You may feel sick to your stomach (nauseous) and you may throw up (vomit). If the vomiting is uncontrollable, call your health care provider.  You may tire easily.  You may develop headaches that can be relieved by medicines. All medicines should be approved by your health care provider.  You may urinate more often. Painful urination may mean you have a bladder infection.  You may develop heartburn as a result of your pregnancy.  You may develop constipation because certain hormones are causing the muscles that push stool through your intestines to slow down.  You may develop hemorrhoids or swollen veins (varicose veins).  Your breasts may begin to grow larger and become tender. Your nipples may stick out more, and the tissue that surrounds them (areola) may become darker.  Your gums may bleed and may be  sensitive to brushing and flossing.  Dark spots or blotches (chloasma, mask of pregnancy) may develop on your face. This will likely fade after the baby is born.  Your menstrual periods will stop.  You may have a loss of appetite.  You may develop cravings for certain kinds of food.  You may have changes in your emotions from day to day, such as being excited to be pregnant or being concerned that something may go wrong with the pregnancy and baby.  You may have more vivid and strange dreams.  You may have changes in your hair. These can include thickening of your hair, rapid growth, and changes in texture. Some women also have hair loss during or after pregnancy, or hair that feels dry or thin. Your hair will most likely return to normal after your baby is born. What to expect at prenatal visits During a routine prenatal visit:  You will be weighed to make sure you and the baby are growing normally.  Your blood pressure will be taken.  Your abdomen will be measured to track your baby's growth.  The fetal heartbeat will be listened to between weeks 10 and 14 of your pregnancy.  Test results from any previous visits will be discussed. Your health care provider may ask you:  How you are feeling.  If you are feeling the baby move.  If you have had any abnormal symptoms, such as leaking fluid, bleeding, severe headaches, or abdominal   cramping.  If you are using any tobacco products, including cigarettes, chewing tobacco, and electronic cigarettes.  If you have any questions. Other tests that may be performed during your first trimester include:  Blood tests to find your blood type and to check for the presence of any previous infections. The tests will also be used to check for low iron levels (anemia) and protein on red blood cells (Rh antibodies). Depending on your risk factors, or if you previously had diabetes during pregnancy, you may have tests to check for high blood sugar  that affects pregnant women (gestational diabetes).  Urine tests to check for infections, diabetes, or protein in the urine.  An ultrasound to confirm the proper growth and development of the baby.  Fetal screens for spinal cord problems (spina bifida) and Down syndrome.  HIV (human immunodeficiency virus) testing. Routine prenatal testing includes screening for HIV, unless you choose not to have this test.  You may need other tests to make sure you and the baby are doing well. Follow these instructions at home: Medicines  Follow your health care provider's instructions regarding medicine use. Specific medicines may be either safe or unsafe to take during pregnancy.  Take a prenatal vitamin that contains at least 600 micrograms (mcg) of folic acid.  If you develop constipation, try taking a stool softener if your health care provider approves. Eating and drinking   Eat a balanced diet that includes fresh fruits and vegetables, whole grains, good sources of protein such as meat, eggs, or tofu, and low-fat dairy. Your health care provider will help you determine the amount of weight gain that is right for you.  Avoid raw meat and uncooked cheese. These carry germs that can cause birth defects in the baby.  Eating four or five small meals rather than three large meals a day may help relieve nausea and vomiting. If you start to feel nauseous, eating a few soda crackers can be helpful. Drinking liquids between meals, instead of during meals, also seems to help ease nausea and vomiting.  Limit foods that are high in fat and processed sugars, such as fried and sweet foods.  To prevent constipation: ? Eat foods that are high in fiber, such as fresh fruits and vegetables, whole grains, and beans. ? Drink enough fluid to keep your urine clear or pale yellow. Activity  Exercise only as directed by your health care provider. Most women can continue their usual exercise routine during  pregnancy. Try to exercise for 30 minutes at least 5 days a week. Exercising will help you: ? Control your weight. ? Stay in shape. ? Be prepared for labor and delivery.  Experiencing pain or cramping in the lower abdomen or lower back is a good sign that you should stop exercising. Check with your health care provider before continuing with normal exercises.  Try to avoid standing for long periods of time. Move your legs often if you must stand in one place for a long time.  Avoid heavy lifting.  Wear low-heeled shoes and practice good posture.  You may continue to have sex unless your health care provider tells you not to. Relieving pain and discomfort  Wear a good support bra to relieve breast tenderness.  Take warm sitz baths to soothe any pain or discomfort caused by hemorrhoids. Use hemorrhoid cream if your health care provider approves.  Rest with your legs elevated if you have leg cramps or low back pain.  If you develop varicose veins in   your legs, wear support hose. Elevate your feet for 15 minutes, 3-4 times a day. Limit salt in your diet. Prenatal care  Schedule your prenatal visits by the twelfth week of pregnancy. They are usually scheduled monthly at first, then more often in the last 2 months before delivery.  Write down your questions. Take them to your prenatal visits.  Keep all your prenatal visits as told by your health care provider. This is important. Safety  Wear your seat belt at all times when driving.  Make a list of emergency phone numbers, including numbers for family, friends, the hospital, and police and fire departments. General instructions  Ask your health care provider for a referral to a local prenatal education class. Begin classes no later than the beginning of month 6 of your pregnancy.  Ask for help if you have counseling or nutritional needs during pregnancy. Your health care provider can offer advice or refer you to specialists for help  with various needs.  Do not use hot tubs, steam rooms, or saunas.  Do not douche or use tampons or scented sanitary pads.  Do not cross your legs for long periods of time.  Avoid cat litter boxes and soil used by cats. These carry germs that can cause birth defects in the baby and possibly loss of the fetus by miscarriage or stillbirth.  Avoid all smoking, herbs, alcohol, and medicines not prescribed by your health care provider. Chemicals in these products affect the formation and growth of the baby.  Do not use any products that contain nicotine or tobacco, such as cigarettes and e-cigarettes. If you need help quitting, ask your health care provider. You may receive counseling support and other resources to help you quit.  Schedule a dentist appointment. At home, brush your teeth with a soft toothbrush and be gentle when you floss. Contact a health care provider if:  You have dizziness.  You have mild pelvic cramps, pelvic pressure, or nagging pain in the abdominal area.  You have persistent nausea, vomiting, or diarrhea.  You have a bad smelling vaginal discharge.  You have pain when you urinate.  You notice increased swelling in your face, hands, legs, or ankles.  You are exposed to fifth disease or chickenpox.  You are exposed to German measles (rubella) and have never had it. Get help right away if:  You have a fever.  You are leaking fluid from your vagina.  You have spotting or bleeding from your vagina.  You have severe abdominal cramping or pain.  You have rapid weight gain or loss.  You vomit blood or material that looks like coffee grounds.  You develop a severe headache.  You have shortness of breath.  You have any kind of trauma, such as from a fall or a car accident. Summary  The first trimester of pregnancy is from week 1 until the end of week 13 (months 1 through 3).  Your body goes through many changes during pregnancy. The changes vary from  woman to woman.  You will have routine prenatal visits. During those visits, your health care provider will examine you, discuss any test results you may have, and talk with you about how you are feeling. This information is not intended to replace advice given to you by your health care provider. Make sure you discuss any questions you have with your health care provider. Document Revised: 06/06/2017 Document Reviewed: 06/05/2016 Elsevier Patient Education  2020 Elsevier Inc.  

## 2019-11-08 NOTE — Progress Notes (Signed)
11/08/2019   Chief Complaint: Missed period  Transfer of Care Patient: no  History of Present Illness: Linda Choi is a 24 y.o. [id number] [redacted]w[redacted]d based on Patient's last menstrual period was 09/05/2019. with an Estimated Date of Delivery: 06/11/20, with the above CC.   Her periods were: regular periods every 28 days She was using no method when she conceived.  Depo after delivery 2 1/2 years ago, but only for 2 shots, then no BC. She has Positive signs or symptoms of nausea/vomiting of pregnancy. She has Negative signs or symptoms of miscarriage or preterm labor She identifies Negative Zika risk factors for her and her partner On any different medications around the time she conceived/early pregnancy: No  History of varicella: Yes   ROS: A 12-point review of systems was performed and negative, except as stated in the above HPI.  OBGYN History: As per HPI. OB History  Gravida Para Term Preterm AB Living  4 3 2 1   2   SAB TAB Ectopic Multiple Live Births        0 3    # Outcome Date GA Lbr Len/2nd Weight Sex Delivery Anes PTL Lv  4 Current           3 Term 04/29/17 [redacted]w[redacted]d / 00:20 9 lb 2.7 oz (4.16 kg) F Vag-Spont None  LIV  2 Term 09/15/15 [redacted]w[redacted]d / 00:20 9 lb 15 oz (4.508 kg) M Vag-Spont None  LIV  1 Preterm 07/09/14 [redacted]w[redacted]d   M Vag-Spont  Y ND    Any issues with any prior pregnancies: yes    G1 21 week PTD    G2,3 Term deliveries after Makena 16-36 weeks Any prior children are healthy, doing well, without any problems or issues: yes History of pap smears: No History of STIs: No   Past Medical History: Past Medical History:  Diagnosis Date  . Anemia   . Cervical incompetence   . Ovarian cyst   . Type A blood, Rh negative     Past Surgical History: Past Surgical History:  Procedure Laterality Date  . NO PAST SURGERIES      Family History:  Family History  Problem Relation Age of Onset  . Eclampsia Mother   . Breast cancer Neg Hx   . Ovarian cancer Neg Hx    She denies  any female cancers, bleeding or blood clotting disorders.  She denies any history of mental retardation, birth defects or genetic disorders in her or the FOB's history  Social History:  Social History   Socioeconomic History  . Marital status: Single    Spouse name: Not on file  . Number of children: 2  . Years of education: GED  . Highest education level: Not on file  Occupational History    Employer: [employer]  Tobacco Use  . Smoking status: Never Smoker  . Smokeless tobacco: Never Used  Substance and Sexual Activity  . Alcohol use: No  . Drug use: No  . Sexual activity: Yes    Partners: Male    Birth control/protection: Injection  Other Topics Concern  . Not on file  Social History Narrative   Lives with boyfriend and dad in a 2 story home.Works at [redacted]w[redacted]d.  Education: GED.    Social Determinants of Health   Financial Resource Strain:   . Difficulty of Paying Living Expenses:   Food Insecurity:   . Worried About [employer] in the Last Year:   . Programme researcher, broadcasting/film/video of [employer]  in the Last Year:   Transportation Needs:   . Film/video editor (Medical):   Marland Kitchen Lack of Transportation (Non-Medical):   Physical Activity:   . Days of Exercise per Week:   . Minutes of Exercise per Session:   Stress:   . Feeling of Stress :   Social Connections:   . Frequency of Communication with Friends and Family:   . Frequency of Social Gatherings with Friends and Family:   . Attends Religious Services:   . Active Member of Clubs or Organizations:   . Attends Archivist Meetings:   Marland Kitchen Marital Status:   Intimate Partner Violence:   . Fear of Current or Ex-Partner:   . Emotionally Abused:   Marland Kitchen Physically Abused:   . Sexually Abused:    Any pets in the household: no  Allergy: No Known Allergies  Current Outpatient Medications: No current outpatient medications on file.   Physical Exam:   BP 100/60   Wt 175 lb (79.4 kg)   LMP 09/05/2019   BMI 30.04 kg/m   Body mass index is 30.04 kg/m. Constitutional: Well nourished, well developed female in no acute distress.  Neck:  Supple, normal appearance, and no thyromegaly  Cardiovascular: S1, S2 normal, no murmur, rub or gallop, regular rate and rhythm Respiratory:  Clear to auscultation bilateral. Normal respiratory effort Abdomen: positive bowel sounds and no masses, hernias; diffusely non tender to palpation, non distended Breasts: breasts appear normal, no suspicious masses, no skin or nipple changes or axillary nodes. Neuro/Psych:  Normal mood and affect.  Skin:  Warm and dry.  Lymphatic:  No inguinal lymphadenopathy.   Pelvic exam: is not limited by body habitus EGBUS: within normal limits, Vagina: within normal limits and with no blood in the vault, Cervix: normal appearing cervix without discharge or lesions, closed/long/high, Uterus:  enlarged: 8 weeks, and Adnexa:  normal adnexa  Assessment: Linda Choi is a 24 y.o. [id number] [redacted]w[redacted]d based on Patient's last menstrual period was 09/05/2019. with an Estimated Date of Delivery: 06/11/20,  for prenatal care.  Plan:  1) Avoid alcoholic beverages. 2) Patient encouraged not to smoke.  3) Discontinue the use of all non-medicinal drugs and chemicals.  4) Take prenatal vitamins daily.  5) Seatbelt use advised 6) Nutrition, food safety (fish, cheese advisories, and high nitrite foods) and exercise discussed. 7) Hospital and practice style delivering at [facility] discussed  8) Patient is asked about travel to areas at risk for the [place] virus, and counseled to avoid travel and exposure to mosquitoes or sexual partners who may have themselves been exposed to the virus. Testing is discussed, and will be ordered as appropriate.  9) [facility]birth classes at [facility] advised 10) Genetic Screening, such as with 1st Trimester Screening, cell free fetal DNA, AFP testing, and Ultrasound, as well as with amniocentesis and CVS as appropriate, is discussed with patient. She plans to  have genetic testing this pregnancy. 11) Plans Makena again, after discussion of pros and cons of this therapy to prevent PTL and PTD, and as she was successful on two prior pregnancies, would use again this pregnancy 12) [place] and LABS nv 13) Diclegis Rx  Problem list reviewed and updated.  Barnett Applebaum, MD, [facility], [facility] 11/08/2019  2:39 PM

## 2019-11-10 LAB — CYTOLOGY - PAP
Chlamydia: NEGATIVE
Comment: NEGATIVE
Comment: NEGATIVE
Comment: NORMAL
Diagnosis: NEGATIVE
Neisseria Gonorrhea: NEGATIVE
Trichomonas: NEGATIVE

## 2019-11-10 LAB — URINE CULTURE

## 2019-11-11 ENCOUNTER — Other Ambulatory Visit: Payer: Self-pay | Admitting: Obstetrics & Gynecology

## 2019-11-11 DIAGNOSIS — O0991 Supervision of high risk pregnancy, unspecified, first trimester: Secondary | ICD-10-CM

## 2019-11-23 ENCOUNTER — Ambulatory Visit (INDEPENDENT_AMBULATORY_CARE_PROVIDER_SITE_OTHER): Payer: Medicaid Other

## 2019-11-23 ENCOUNTER — Other Ambulatory Visit: Payer: Self-pay | Admitting: Obstetrics & Gynecology

## 2019-11-23 ENCOUNTER — Encounter: Payer: Self-pay | Admitting: Obstetrics and Gynecology

## 2019-11-23 ENCOUNTER — Other Ambulatory Visit: Payer: Self-pay

## 2019-11-23 ENCOUNTER — Ambulatory Visit (INDEPENDENT_AMBULATORY_CARE_PROVIDER_SITE_OTHER): Payer: Medicaid Other | Admitting: Obstetrics and Gynecology

## 2019-11-23 VITALS — BP 112/68 | Wt 173.0 lb

## 2019-11-23 DIAGNOSIS — O26891 Other specified pregnancy related conditions, first trimester: Secondary | ICD-10-CM

## 2019-11-23 DIAGNOSIS — Z369 Encounter for antenatal screening, unspecified: Secondary | ICD-10-CM

## 2019-11-23 DIAGNOSIS — Z3A09 9 weeks gestation of pregnancy: Secondary | ICD-10-CM | POA: Diagnosis not present

## 2019-11-23 DIAGNOSIS — O09291 Supervision of pregnancy with other poor reproductive or obstetric history, first trimester: Secondary | ICD-10-CM

## 2019-11-23 DIAGNOSIS — Z6791 Unspecified blood type, Rh negative: Secondary | ICD-10-CM

## 2019-11-23 DIAGNOSIS — Z3A10 10 weeks gestation of pregnancy: Secondary | ICD-10-CM

## 2019-11-23 DIAGNOSIS — Z1379 Encounter for other screening for genetic and chromosomal anomalies: Secondary | ICD-10-CM

## 2019-11-23 DIAGNOSIS — O09299 Supervision of pregnancy with other poor reproductive or obstetric history, unspecified trimester: Secondary | ICD-10-CM

## 2019-11-23 DIAGNOSIS — O0991 Supervision of high risk pregnancy, unspecified, first trimester: Secondary | ICD-10-CM

## 2019-11-23 LAB — POCT URINALYSIS DIPSTICK OB
Glucose, UA: NEGATIVE
POC,PROTEIN,UA: NEGATIVE

## 2019-11-23 MED ORDER — METOCLOPRAMIDE HCL 10 MG PO TABS: 10.0000 mg | ORAL_TABLET | Freq: Three times a day (TID) | ORAL | 2 refills | Status: DC | PRN

## 2019-11-23 MED ORDER — PRENATE PIXIE 10-0.6-0.4-200 MG PO CAPS: 1.0000 | ORAL_CAPSULE | Freq: Every day | ORAL | 3 refills | Status: DC

## 2019-11-23 MED ORDER — HYDROXYPROGESTERONE CAPROATE 250 MG/ML IM OIL: 250.0000 mg | TOPICAL_OIL | Freq: Once | INTRAMUSCULAR | 20 refills | Status: DC

## 2019-11-23 NOTE — Progress Notes (Signed)
Routine Prenatal Care Visit  Subjective  Linda Choi is a 24 y.o. [phone] at [redacted]w[redacted]d being seen today for ongoing prenatal care.  She is currently monitored for the following issues for this high-risk pregnancy and has History of pregnancy loss in prior pregnancy, currently pregnant in first trimester; High-risk pregnancy, first trimester; Rh negative state in antepartum period; History of macrosomia in infant in prior pregnancy, currently pregnant; Pregnancy; and UTI (urinary tract infection) on their problem list.  ----------------------------------------------------------------------------------- Patient reports no complaints.    . Vag. Bleeding: None.   . Leaking Fluid denies.  U/s today changes due date due to very uncertain LMP and u/s in ER with only small gestational sac.  ----------------------------------------------------------------------------------- The following portions of the patient's history were reviewed and updated as appropriate: allergies, current medications, past family history, past medical history, past social history, past surgical history and problem list. Problem list updated.  Objective  Blood pressure 112/68, weight 173 lb (78.5 kg), last menstrual period 09/05/2019. Pregravid weight 156 lb (70.8 kg) Total Weight Gain 17 lb (7.711 kg) Urinalysis: Urine Protein Negative  Urine Glucose Negative  Fetal Status: Fetal Heart Rate (bpm): present         General:  Alert, oriented and cooperative. Patient is in no acute distress.  Skin: Skin is warm and dry. No rash noted.   Cardiovascular: Normal heart rate noted  Respiratory: Normal respiratory effort, no problems with respiration noted  Abdomen: Soft, gravid, appropriate for gestational age.       Pelvic:  Cervical exam deferred        Extremities: Normal range of motion.     Mental Status: Normal mood and affect. Normal behavior. Normal judgment and thought content.   Imaging Results US OB  Transvaginal  Result Date: 11/23/2019 Patient Name: Linda Choi DOB: [birth year] MRN: [id number] ULTRASOUND REPORT Location: [facility] Date of Service: 11/23/2019 Indications:dating/viability Findings: Mason Jim intrauterine pregnancy is visualized with a CRL consistent with [redacted]w[redacted]d gestation, giving an (U/S) EDD of 06/16/2020. FHR: 164 BPM CRL measurement: 37.0 mm Yolk sac is visualized and appears normal. Amnion: visualized and appears normal Right Ovary is normal in appearance. Left Ovary is normal appearance. Corpus luteal cyst:  Left ovary Survey of the adnexa demonstrates no adnexal masses. There is no free peritoneal fluid in the cul de sac. Impression: 1. 100w4d Viable Singleton Intrauterine pregnancy by U/S. Linda Choi, RT There is a viable singleton gestation.  Detailed evaluation of the fetal anatomy is precluded by early gestational age.  It must be noted that a normal ultrasound particular at this early gestational age is unable to rule out fetal aneuploidy, risk of first trimester miscarriage, or anatomic birth defects. Linda Mohair, MD, [facility], [facility] 11/23/2019 1:28 PM      Assessment   24 y.o. [id number] at [redacted]w[redacted]d by  06/16/2020, by Ultrasound presenting for routine prenatal visit  Plan   pregnancy 4 Problems (from 09/05/19 to present)    Problem Noted Resolved   History of macrosomia in infant in prior pregnancy, currently pregnant 01/23/2017 by Vena Austria, MD No   Overview Signed 01/23/2017 10:20 AM by Vena Austria, MD    [ ]  Growth scan at 36 weeks      History of pregnancy loss in prior pregnancy, currently pregnant in first trimester 10/03/2016 by 10/05/2016, CNM No   Overview Signed 10/03/2016 10:27 PM by 10/05/2016, CNM    Needs 17P injections starting at 16 weeks.  High-risk pregnancy, first trimester 10/03/2016 by Dalia Heading, CNM No   Overview Addendum 11/11/2019  8:03 AM by Gae Dry, MD    Clinic [facility] Prenatal Labs  Dating LMP Blood type:     Genetic Screen NIPS: Antibody:   Anatomic [place]  Rubella:   Varicella: @VZVIGG @  GTT Third trimester:  RPR:     Rhogam  HBsAg:     TDaP vaccine             Flu Shot: HIV:     Baby Food                                GBS:   Contraception  Pap:11/08/19  CBB  no   CS/VBAC n/a   Support Person     Needs 17P injections starting at 16 weeks         Rh negative state in antepartum period 10/03/2016 by Dalia Heading, CNM No       Preterm labor symptoms and general obstetric precautions including but not limited to vaginal bleeding, contractions, leaking of fluid and fetal movement were reviewed in detail with the patient. Please refer to After Visit Summary for other counseling recommendations.   -17 OHP ordered - EDD adjusted for today's ultrasound. Previously based on very unsure LMP.  ER ultrasound with very small gestational sac.  So, a rough estimate.  So, will base on today's ultrasound.   Return in about 4 weeks (around 12/21/2019) for Routine Prenatal Appointment.  Prentice Docker, MD, [facility], [facility] 11/23/2019 2:06 PM

## 2019-11-24 LAB — RPR+RH+ABO+RUB AB+AB SCR+CB...
Antibody Screen: NEGATIVE
HIV Screen 4th Generation wRfx: NONREACTIVE
Hematocrit: 37.4 % (ref 34.0–46.6)
Hemoglobin: 12.6 g/dL (ref 11.1–15.9)
Hepatitis B Surface Ag: NEGATIVE
MCH: 28.8 pg (ref 26.6–33.0)
MCHC: 33.7 g/dL (ref 31.5–35.7)
MCV: 86 fL (ref 79–97)
Platelets: 186 10*3/uL (ref 150–450)
RBC: 4.37 x10E6/uL (ref 3.77–5.28)
RDW: 12.2 % (ref 11.7–15.4)
RPR Ser Ql: NONREACTIVE
Rh Factor: NEGATIVE
Rubella Antibodies, IGG: 1.42 index (ref >0.99)
Varicella zoster IgG: 191 index (ref >165)
WBC: 7.4 10*3/uL (ref 3.4–10.8)

## 2019-11-27 LAB — MATERNIT 21 PLUS CORE, BLOOD
Fetal Fraction: 7
Result (T21): NEGATIVE
Trisomy 13 (Patau syndrome): NEGATIVE
Trisomy 18 (Edwards syndrome): NEGATIVE
Trisomy 21 (Down syndrome): NEGATIVE

## 2019-12-08 ENCOUNTER — Telehealth: Payer: Self-pay

## 2019-12-08 NOTE — Telephone Encounter (Signed)
Please add this patient to my Friday schedule for 0900AM [facility] appointment

## 2019-12-08 NOTE — Telephone Encounter (Signed)
Scheduled

## 2019-12-08 NOTE — Telephone Encounter (Signed)
Pt left msg on triage saying she was at work (now home) due to blacking out and feeling dizzy. Now she is dizzy and shaky. She has been eating fine and staying hydrated. Please advise if pt should monitor symptoms or if she should be seen soon (next OB appt is 6/15). Denies shortness of breath or heart palpitations. Has stopped taking diclegis since she hasn't been vomiting anymore.

## 2019-12-10 ENCOUNTER — Other Ambulatory Visit: Payer: Self-pay

## 2019-12-10 ENCOUNTER — Ambulatory Visit (INDEPENDENT_AMBULATORY_CARE_PROVIDER_SITE_OTHER): Payer: Medicaid Other | Admitting: Certified Nurse Midwife

## 2019-12-10 VITALS — BP 98/64 | Wt 175.0 lb

## 2019-12-10 DIAGNOSIS — Z3A13 13 weeks gestation of pregnancy: Secondary | ICD-10-CM

## 2019-12-10 DIAGNOSIS — I951 Orthostatic hypotension: Secondary | ICD-10-CM

## 2019-12-10 LAB — POCT URINALYSIS DIPSTICK OB
Glucose, UA: NEGATIVE
POC,PROTEIN,UA: NEGATIVE

## 2019-12-10 NOTE — Progress Notes (Signed)
Follow up- blacking out and feeling dizzy

## 2019-12-11 NOTE — Progress Notes (Signed)
Work in at [employer] for lightheadedness and presyncopal episodes at work on Tuesday. Was sitting down at the time she felt lightheaded. Had eaten just prior to both the episodes-ate protein and fat, not just CHOs. She felt hot and weak, did not have chest pain or SOB. Stayed home from work since then per [employer] recommendations.  Exam: General: awake and alert, in NAD Initial blood pressure sitting was 80/60, then lying down was 98/64 Heart: RRR without murmur. AP 76 BPM Lungs: CTA, normal respiratory effort Abdomen: soft, NT. FH at SP + 4FB, FHT 153  CBC normal 2 weeks ago  A: Presyncopal episodes probably due to orthostatic hypotension 2/2 vasodilation from pregnancy  P: Encouraged drinking increased fluids. Can drink up to 2 servings of caffeine product/ day Safety measures to prevent falls from syncopal episodes Work note to excuse from work x 1 week. Follow up at next ROB visit. Consider cardiology consult if symptoms persist past 20 weeks  Farrel Conners, CNM

## 2019-12-13 ENCOUNTER — Other Ambulatory Visit: Payer: Self-pay | Admitting: Obstetrics & Gynecology

## 2019-12-21 ENCOUNTER — Other Ambulatory Visit: Payer: Self-pay

## 2019-12-21 ENCOUNTER — Encounter: Payer: Self-pay | Admitting: Obstetrics & Gynecology

## 2019-12-21 ENCOUNTER — Ambulatory Visit (INDEPENDENT_AMBULATORY_CARE_PROVIDER_SITE_OTHER): Payer: Medicaid Other | Admitting: Obstetrics & Gynecology

## 2019-12-21 VITALS — BP 120/80 | Wt 179.0 lb

## 2019-12-21 DIAGNOSIS — Z3A14 14 weeks gestation of pregnancy: Secondary | ICD-10-CM

## 2019-12-21 DIAGNOSIS — O0992 Supervision of high risk pregnancy, unspecified, second trimester: Secondary | ICD-10-CM

## 2019-12-21 DIAGNOSIS — Z3689 Encounter for other specified antenatal screening: Secondary | ICD-10-CM

## 2019-12-21 DIAGNOSIS — O09292 Supervision of pregnancy with other poor reproductive or obstetric history, second trimester: Secondary | ICD-10-CM

## 2019-12-21 LAB — POCT URINALYSIS DIPSTICK OB
Glucose, UA: NEGATIVE
POC,PROTEIN,UA: NEGATIVE

## 2019-12-21 NOTE — Progress Notes (Signed)
  Subjective  Fetal Movement? yes Pain or Contractions? no Nausea? no Vaginal Bleeding? no  Objective  BP 120/80   Wt 179 lb (81.2 kg)   LMP 09/05/2019 (LMP Unknown)   BMI 30.73 kg/m  General: NAD Pumonary: no increased work of breathing Abdomen: gravid, non-tender Extremities: no edema Psychiatric: mood appropriate, affect full  Assessment  24 y.o. [id number] at [redacted]w[redacted]d by  06/16/2020, by Ultrasound presenting for routine prenatal visit  Plan   Problem List Items Addressed This Visit      Other   History of pregnancy loss in prior pregnancy, currently pregnant in first trimester   High-risk pregnancy, first trimester   Pregnancy - Primary    Other Visit Diagnoses    Screening, antenatal, for fetal anatomic survey       Relevant Orders   US OB Comp + 14 Wk      pregnancy 4 Problems (from 09/05/19 to present)    Problem Noted Resolved   History of macrosomia in infant in prior pregnancy, currently pregnant 01/23/2017 by Vena Austria, MD No   Overview Signed 01/23/2017 10:20 AM by Vena Austria, MD    [ ]  Growth scan at 36 weeks      History of pregnancy loss in prior pregnancy, currently pregnant in first trimester 10/03/2016 by 10/05/2016, CNM No   Overview Signed 10/03/2016 10:27 PM by 10/05/2016, CNM    Needs 17P injections starting at 16 weeks.      High-risk pregnancy, first trimester 10/03/2016 by 10/05/2016, CNM No   Overview Addendum 11/11/2019  8:03 AM by 01/11/2020, MD    Clinic [facility] Prenatal Labs  Dating LMP Blood type:   A NEG  Genetic Screen NIPS:bml XY Antibody:   Anatomic Nadara Mustard  Rubella:   Varicella: @VZVIGG @  GTT Third trimester:  RPR:     Rhogam needed HBsAg:     TDaP vaccine             Flu Shot: HIV:     Baby Food    Bottle GBS:   Contraception     Unsure, counseld as to [place] and IUD Pap:11/08/19  CBB  no   CS/VBAC n/a   Support Person     Needs 17P injections starting at 16 weeks         Previous  Version   Rh negative state in antepartum period 10/03/2016 by 01/08/20, CNM No     Sch weekly vitis staring at 16 weeks  PNV  10/05/2016, MD, [facility], [facility] 12/21/2019  11:16 AM

## 2019-12-21 NOTE — Addendum Note (Signed)
Addended by: Cornelius Moras D on: 12/21/2019 11:21 AM   Modules accepted: Orders

## 2019-12-21 NOTE — Patient Instructions (Signed)
Thank you for choosing [facility]. As part of our ongoing efforts to improve patient experience, we would appreciate your feedback. Please fill out the short survey that you will receive by mail or MyChart. Your opinion is important to [place]!   Second Trimester of Pregnancy The second trimester is from week 14 through week 27 (months 4 through 6). The second trimester is often a time when you feel your best. Your body has adjusted to being pregnant, and you begin to feel better physically. Usually, morning sickness has lessened or quit completely, you may have more energy, and you may have an increase in appetite. The second trimester is also a time when the fetus is growing rapidly. At the end of the sixth month, the fetus is about 9 inches long and weighs about 1 pounds. You will likely begin to feel the baby move (quickening) between 16 and 20 weeks of pregnancy. Body changes during your second trimester Your body continues to go through many changes during your second trimester. The changes vary from woman to woman.  Your weight will continue to increase. You will notice your lower abdomen bulging out.  You may begin to get stretch marks on your hips, abdomen, and breasts.  You may develop headaches that can be relieved by medicines. The medicines should be approved by your health care provider.  You may urinate more often because the fetus is pressing on your bladder.  You may develop or continue to have heartburn as a result of your pregnancy.  You may develop constipation because certain hormones are causing the muscles that push waste through your intestines to slow down.  You may develop hemorrhoids or swollen, bulging veins (varicose veins).  You may have back pain. This is caused by: ? Weight gain. ? Pregnancy hormones that are relaxing the joints in your pelvis. ? A shift in weight and the muscles that support your balance.  Your breasts will continue to grow and they will  continue to become tender.  Your gums may bleed and may be sensitive to brushing and flossing.  Dark spots or blotches (chloasma, mask of pregnancy) may develop on your face. This will likely fade after the baby is born.  A dark line from your belly button to the pubic area (linea nigra) may appear. This will likely fade after the baby is born.  You may have changes in your hair. These can include thickening of your hair, rapid growth, and changes in texture. Some women also have hair loss during or after pregnancy, or hair that feels dry or thin. Your hair will most likely return to normal after your baby is born. What to expect at prenatal visits During a routine prenatal visit:  You will be weighed to make sure you and the fetus are growing normally.  Your blood pressure will be taken.  Your abdomen will be measured to track your baby's growth.  The fetal heartbeat will be listened to.  Any test results from the previous visit will be discussed. Your health care provider may ask you:  How you are feeling.  If you are feeling the baby move.  If you have had any abnormal symptoms, such as leaking fluid, bleeding, severe headaches, or abdominal cramping.  If you are using any tobacco products, including cigarettes, chewing tobacco, and electronic cigarettes.  If you have any questions. Other tests that may be performed during your second trimester include:  Blood tests that check for: ? Low iron levels (anemia). ?  High blood sugar that affects pregnant women (gestational diabetes) between 25 and 28 weeks. ? Rh antibodies. This is to check for a protein on red blood cells (Rh factor).  Urine tests to check for infections, diabetes, or protein in the urine.  An ultrasound to confirm the proper growth and development of the baby.  An amniocentesis to check for possible genetic problems.  Fetal screens for spina bifida and Down syndrome.  HIV (human immunodeficiency virus)  testing. Routine prenatal testing includes screening for HIV, unless you choose not to have this test. Follow these instructions at home: Medicines  Follow your health care provider's instructions regarding medicine use. Specific medicines may be either safe or unsafe to take during pregnancy.  Take a prenatal vitamin that contains at least 600 micrograms (mcg) of folic acid.  If you develop constipation, try taking a stool softener if your health care provider approves. Eating and drinking   Eat a balanced diet that includes fresh fruits and vegetables, whole grains, good sources of protein such as meat, eggs, or tofu, and low-fat dairy. Your health care provider will help you determine the amount of weight gain that is right for you.  Avoid raw meat and uncooked cheese. These carry germs that can cause birth defects in the baby.  If you have low calcium intake from food, talk to your health care provider about whether you should take a daily calcium supplement.  Limit foods that are high in fat and processed sugars, such as fried and sweet foods.  To prevent constipation: ? Drink enough fluid to keep your urine clear or pale yellow. ? Eat foods that are high in fiber, such as fresh fruits and vegetables, whole grains, and beans. Activity  Exercise only as directed by your health care provider. Most women can continue their usual exercise routine during pregnancy. Try to exercise for 30 minutes at least 5 days a week. Stop exercising if you experience uterine contractions.  Avoid heavy lifting, wear low heel shoes, and practice good posture.  A sexual relationship may be continued unless your health care provider directs you otherwise. Relieving pain and discomfort  Wear a good support bra to prevent discomfort from breast tenderness.  Take warm sitz baths to soothe any pain or discomfort caused by hemorrhoids. Use hemorrhoid cream if your health care provider approves.  Rest  with your legs elevated if you have leg cramps or low back pain.  If you develop varicose veins, wear support hose. Elevate your feet for 15 minutes, 3-4 times a day. Limit salt in your diet. Prenatal Care  Write down your questions. Take them to your prenatal visits.  Keep all your prenatal visits as told by your health care provider. This is important. Safety  Wear your seat belt at all times when driving.  Make a list of emergency phone numbers, including numbers for family, friends, the hospital, and police and fire departments. General instructions  Ask your health care provider for a referral to a local prenatal education class. Begin classes no later than the beginning of month 6 of your pregnancy.  Ask for help if you have counseling or nutritional needs during pregnancy. Your health care provider can offer advice or refer you to specialists for help with various needs.  Do not use hot tubs, steam rooms, or saunas.  Do not douche or use tampons or scented sanitary pads.  Do not cross your legs for long periods of time.  Avoid cat litter boxes and  soil used by cats. These carry germs that can cause birth defects in the baby and possibly loss of the fetus by miscarriage or stillbirth.  Avoid all smoking, herbs, alcohol, and unprescribed drugs. Chemicals in these products can affect the formation and growth of the baby.  Do not use any products that contain nicotine or tobacco, such as cigarettes and e-cigarettes. If you need help quitting, ask your health care provider.  Visit your dentist if you have not gone yet during your pregnancy. Use a soft toothbrush to brush your teeth and be gentle when you floss. Contact a health care provider if:  You have dizziness.  You have mild pelvic cramps, pelvic pressure, or nagging pain in the abdominal area.  You have persistent nausea, vomiting, or diarrhea.  You have a bad smelling vaginal discharge.  You have pain when you  urinate. Get help right away if:  You have a fever.  You are leaking fluid from your vagina.  You have spotting or bleeding from your vagina.  You have severe abdominal cramping or pain.  You have rapid weight gain or weight loss.  You have shortness of breath with chest pain.  You notice sudden or extreme swelling of your face, hands, ankles, feet, or legs.  You have not felt your baby move in over an hour.  You have severe headaches that do not go away when you take medicine.  You have vision changes. Summary  The second trimester is from week 14 through week 27 (months 4 through 6). It is also a time when the fetus is growing rapidly.  Your body goes through many changes during pregnancy. The changes vary from woman to woman.  Avoid all smoking, herbs, alcohol, and unprescribed drugs. These chemicals affect the formation and growth your baby.  Do not use any tobacco products, such as cigarettes, chewing tobacco, and e-cigarettes. If you need help quitting, ask your health care provider.  Contact your health care provider if you have any questions. Keep all prenatal visits as told by your health care provider. This is important. This information is not intended to replace advice given to you by your health care provider. Make sure you discuss any questions you have with your health care provider. Document Revised: 10/16/2018 Document Reviewed: 07/30/2016 Elsevier Patient Education  2020 [employer].

## 2020-01-04 ENCOUNTER — Ambulatory Visit (INDEPENDENT_AMBULATORY_CARE_PROVIDER_SITE_OTHER): Payer: Medicaid Other | Admitting: Advanced Practice Midwife

## 2020-01-04 ENCOUNTER — Other Ambulatory Visit: Payer: Self-pay

## 2020-01-04 ENCOUNTER — Encounter: Payer: Self-pay | Admitting: Advanced Practice Midwife

## 2020-01-04 VITALS — BP 118/74 | Wt 181.0 lb

## 2020-01-04 DIAGNOSIS — O0992 Supervision of high risk pregnancy, unspecified, second trimester: Secondary | ICD-10-CM

## 2020-01-04 DIAGNOSIS — O09292 Supervision of pregnancy with other poor reproductive or obstetric history, second trimester: Secondary | ICD-10-CM

## 2020-01-04 DIAGNOSIS — Z3A16 16 weeks gestation of pregnancy: Secondary | ICD-10-CM | POA: Diagnosis not present

## 2020-01-04 MED ORDER — HYDROXYPROGESTERONE CAPROATE 250 MG/ML IM OIL
250.0000 mg | TOPICAL_OIL | Freq: Once | INTRAMUSCULAR | Status: AC
  Administered 2020-01-04: 250 mg via INTRAMUSCULAR

## 2020-01-04 NOTE — Progress Notes (Signed)
No vb. No lof. 17 P today 

## 2020-01-04 NOTE — Progress Notes (Signed)
Routine Prenatal Care Visit  Subjective  Linda Choi is a 24 y.o. [id number] at [redacted]w[redacted]d being seen today for ongoing prenatal care.  She is currently monitored for the following issues for this high-risk pregnancy and has History of pregnancy loss in prior pregnancy, currently pregnant in first trimester; High-risk pregnancy, first trimester; Rh negative state in antepartum period; History of macrosomia in infant in prior pregnancy, currently pregnant; Pregnancy; and UTI (urinary tract infection) on their problem list.  ----------------------------------------------------------------------------------- Patient reports no complaints.    . Vag. Bleeding: None.  Movement: Absent. Leaking Fluid denies.  ----------------------------------------------------------------------------------- The following portions of the patient's history were reviewed and updated as appropriate: allergies, current medications, past family history, past medical history, past social history, past surgical history and problem list. Problem list updated.  Objective  Blood pressure 118/74, weight 181 lb (82.1 kg), last menstrual period 09/05/2019. Pregravid weight 156 lb (70.8 kg) Total Weight Gain 25 lb (11.3 kg) Urinalysis: Urine Protein    Urine Glucose    Fetal Status: Fetal Heart Rate (bpm): 140   Movement: Absent     General:  Alert, oriented and cooperative. Patient is in no acute distress.  Skin: Skin is warm and dry. No rash noted.   Cardiovascular: Normal heart rate noted  Respiratory: Normal respiratory effort, no problems with respiration noted  Abdomen: Soft, gravid, appropriate for gestational age. Pain/Pressure: Absent     Pelvic:  Cervical exam deferred        Extremities: Normal range of motion.  Edema: None  Mental Status: Normal mood and affect. Normal behavior. Normal judgment and thought content.   Assessment   24 y.o. [id number] at [redacted]w[redacted]d by  06/16/2020, by Ultrasound presenting for routine  prenatal visit  Plan   pregnancy 4 Problems (from 09/05/19 to present)    Problem Noted Resolved   History of macrosomia in infant in prior pregnancy, currently pregnant 01/23/2017 by Vena Austria, MD No   Overview Signed 01/23/2017 10:20 AM by Vena Austria, MD    [ ]  Growth scan at 36 weeks      History of pregnancy loss in prior pregnancy, currently pregnant in first trimester 10/03/2016 by 10/05/2016, CNM No   Overview Signed 10/03/2016 10:27 PM by 10/05/2016, CNM    Needs 17P injections starting at 16 weeks.      High-risk pregnancy, first trimester 10/03/2016 by 10/05/2016, CNM No   Overview Addendum 12/21/2019 11:18 AM by 12/23/2019, MD    Clinic [facility] Prenatal Labs  Dating LMP Blood type: A/Negative/-- (05/18 1417)   Genetic Screen NIPS:nml XY Antibody:Negative (05/18 1417)  Anatomic 10-30-1979  Rubella: 1.42 (05/18 1417) Varicella: Imm  GTT Third trimester:  RPR: Non Reactive (05/18 1417)   Rhogam needed HBsAg: Negative (05/18 1417)   TDaP vaccine             Flu Shot: HIV: Non Reactive (05/18 1417)   Baby Food        Bottle                        GBS:   Contraception  Unsure, counseled as to Nexplanon and IUD Pap:11/08/19  CBB  no   CS/VBAC n/a   Support Person     Needs 17P injections starting at 16 weeks         Previous Version   Rh negative state in antepartum period 10/03/2016 by 10/05/2016, CNM No  Preterm labor symptoms and general obstetric precautions including but not limited to vaginal bleeding, contractions, leaking of fluid and fetal movement were reviewed in detail with the patient.    Return in about 4 weeks (around 02/01/2020) for has f/u scheduled.  Tresea Mall, CNM 01/04/2020 5:27 PM

## 2020-01-11 ENCOUNTER — Ambulatory Visit (INDEPENDENT_AMBULATORY_CARE_PROVIDER_SITE_OTHER): Payer: Medicaid Other

## 2020-01-11 ENCOUNTER — Other Ambulatory Visit: Payer: Self-pay

## 2020-01-11 DIAGNOSIS — O09292 Supervision of pregnancy with other poor reproductive or obstetric history, second trimester: Secondary | ICD-10-CM

## 2020-01-11 MED ORDER — HYDROXYPROGESTERONE CAPROATE 250 MG/ML IM OIL
250.0000 mg | TOPICAL_OIL | Freq: Once | INTRAMUSCULAR | Status: AC
  Administered 2020-01-11: 250 mg via INTRAMUSCULAR

## 2020-01-11 NOTE — Progress Notes (Signed)
Pt here for hydroxyprogesterone 250mg  which was given IM left glut.  NDC# [phone]

## 2020-01-18 ENCOUNTER — Ambulatory Visit (INDEPENDENT_AMBULATORY_CARE_PROVIDER_SITE_OTHER): Payer: Medicaid Other

## 2020-01-18 ENCOUNTER — Other Ambulatory Visit: Payer: Self-pay

## 2020-01-18 ENCOUNTER — Ambulatory Visit (INDEPENDENT_AMBULATORY_CARE_PROVIDER_SITE_OTHER): Payer: Medicaid Other | Admitting: Obstetrics and Gynecology

## 2020-01-18 VITALS — BP 122/66 | Wt 185.0 lb

## 2020-01-18 DIAGNOSIS — O0990 Supervision of high risk pregnancy, unspecified, unspecified trimester: Secondary | ICD-10-CM

## 2020-01-18 DIAGNOSIS — O09892 Supervision of other high risk pregnancies, second trimester: Secondary | ICD-10-CM

## 2020-01-18 DIAGNOSIS — Z3689 Encounter for other specified antenatal screening: Secondary | ICD-10-CM

## 2020-01-18 DIAGNOSIS — Z3A18 18 weeks gestation of pregnancy: Secondary | ICD-10-CM

## 2020-01-18 MED ORDER — HYDROXYPROGESTERONE CAPROATE 250 MG/ML IM OIL
250.0000 mg | TOPICAL_OIL | Freq: Once | INTRAMUSCULAR | Status: AC
  Administered 2020-01-18: 250 mg via INTRAMUSCULAR

## 2020-01-18 NOTE — Progress Notes (Signed)
Routine Prenatal Care Visit  Subjective  Linda Choi is a 24 y.o. [id number] at [redacted]w[redacted]d being seen today for ongoing prenatal care.  She is currently monitored for the following issues for this high-risk pregnancy and has History of pregnancy loss in prior pregnancy, currently pregnant in first trimester; Supervision of high risk pregnancy, antepartum; Rh negative state in antepartum period; History of macrosomia in infant in prior pregnancy, currently pregnant; Pregnancy; and UTI (urinary tract infection) on their problem list.  ----------------------------------------------------------------------------------- Patient reports no complaints.   Contractions: Not present. Vag. Bleeding: None.  Movement: Present. Denies leaking of fluid.  ----------------------------------------------------------------------------------- The following portions of the patient's history were reviewed and updated as appropriate: allergies, current medications, past family history, past medical history, past social history, past surgical history and problem list. Problem list updated.   Objective  Last menstrual period 09/05/2019. Pregravid weight 156 lb (70.8 kg) Total Weight Gain 29 lb (13.2 kg) Urinalysis:      Fetal Status: Fetal Heart Rate (bpm): 155   Movement: Present     General:  Alert, oriented and cooperative. Patient is in no acute distress.  Skin: Skin is warm and dry. No rash noted.   Cardiovascular: Normal heart rate noted  Respiratory: Normal respiratory effort, no problems with respiration noted  Abdomen: Soft, gravid, appropriate for gestational age. Pain/Pressure: Absent     Pelvic:  Cervical exam deferred        Extremities: Normal range of motion.     ental Status: Normal mood and affect. Normal behavior. Normal judgment and thought content.   US OB Comp + 14 Wk  Result Date: 01/18/2020 Patient Name: Linda Choi DOB: March 01, 1996 MRN: [id number] ULTRASOUND REPORT Location:  [facility] Date of Service: 01/18/2020 Indications:Anatomy Ultrasound Findings: Mason Jim intrauterine pregnancy is visualized with FHR at 153 BPM. Biometrics give an (U/S) Gestational age of [redacted]w[redacted]d and an (U/S) EDD of 06/14/2020; this correlates with the clinically established Estimated Date of Delivery: 06/16/20 Fetal presentation is Breech. EFW: 273 g ( 10 oz ). Placenta: anterior. Grade: 1 AFI: subjectively normal. Anatomic survey is complete and normal; Gender - female.  Impression: 1. [redacted]w[redacted]d Viable Singleton Intrauterine pregnancy by U/S. 2. (U/S) EDD is consistent with Clinically established Estimated Date of Delivery: 06/16/20 . 3. Normal Anatomy Scan Recommendations: 1.Clinical correlation with the patient's History and Physical Exam. Deanna Artis, RT  There is a singleton gestation with subjectively normal amniotic fluid volume. The fetal biometry correlates with established dating. Detailed evaluation of the fetal anatomy was performed.The fetal anatomical survey appears within normal limits within the resolution of ultrasound as described above.  It must be noted that a normal ultrasound is unable to rule out fetal aneuploidy, subtle defects such as small ASD or VDS may also not be visible on imaging.  Vena Austria, MD, [facility], [facility] 01/18/2020, 2:21 PM     Assessment   24 y.o. [id number] at [redacted]w[redacted]d by  06/16/2020, by Ultrasound presenting for routine prenatal visit  Plan   pregnancy 4 Problems (from 09/05/19 to present)    Problem Noted Resolved   History of macrosomia in infant in prior pregnancy, currently pregnant 01/23/2017 by Vena Austria, MD No   Overview Signed 01/23/2017 10:20 AM by Vena Austria, MD    [ ]  Growth scan at 36 weeks      History of pregnancy loss in prior pregnancy, currently pregnant in first trimester 10/03/2016 by 10/05/2016, CNM No   Overview  Signed 10/03/2016 10:27 PM by Farrel Conners, CNM    Needs 17P  injections starting at 16 weeks.      High-risk pregnancy, first trimester 10/03/2016 by Farrel Conners, CNM No   Overview Addendum 12/21/2019 11:18 AM by Nadara Mustard, MD    [facility]inic [facility] Prenatal Labs  Dating LMP = 10 week [place] Blood type: A/Negative/-- (05/18 1417)   Genetic Screen NIPS:nml XY Antibody:Negative (05/18 1417)  Anatomic [place]  Rubella: 1.42 (05/18 1417) Varicella: Imm  GTT Third trimester:  RPR: Non Reactive (05/18 1417)   Rhogam needed HBsAg: Negative (05/18 1417)   TDaP vaccine             Flu Shot: HIV: Non Reactive (05/18 1417)   Baby Food Bottle                        GBS:   Contraception  Unsure, counseled as to Nexplanon and IUD Pap:11/08/19  CBB  no   CS/VBAC n/a   Support Person     Needs 17P injections starting at 16 weeks         Previous Version   Rh negative state in antepartum period 10/03/2016 by Farrel Conners, CNM No       Gestational age appropriate obstetric precautions including but not limited to vaginal bleeding, contractions, leaking of fluid and fetal movement were reviewed in detail with the patient.    Anatomy scan complete today, continue weekly Makena   Return in about 1 week (around 01/25/2020) for Makena (weekly) 4 weeks ROB.  Vena Austria, MD, [facility], [facility] 01/18/2020, 2:32 PM

## 2020-01-18 NOTE — Progress Notes (Signed)
ROB Makena today/Anatomy scan today

## 2020-01-25 ENCOUNTER — Other Ambulatory Visit: Payer: Self-pay

## 2020-01-25 ENCOUNTER — Ambulatory Visit (INDEPENDENT_AMBULATORY_CARE_PROVIDER_SITE_OTHER): Payer: Medicaid Other

## 2020-01-25 DIAGNOSIS — O09291 Supervision of pregnancy with other poor reproductive or obstetric history, first trimester: Secondary | ICD-10-CM

## 2020-01-25 MED ORDER — HYDROXYPROGESTERONE CAPROATE 250 MG/ML IM OIL
250.0000 mg | TOPICAL_OIL | Freq: Once | INTRAMUSCULAR | Status: AC
  Administered 2020-01-25: 250 mg via INTRAMUSCULAR

## 2020-01-25 NOTE — Progress Notes (Signed)
Patient presents today for Hydroxyprogesterone injection within dates. Given IM RUOQ. Patient tolerated well.  

## 2020-02-01 ENCOUNTER — Ambulatory Visit (INDEPENDENT_AMBULATORY_CARE_PROVIDER_SITE_OTHER): Payer: Medicaid Other

## 2020-02-01 ENCOUNTER — Other Ambulatory Visit: Payer: Self-pay

## 2020-02-01 DIAGNOSIS — O09892 Supervision of other high risk pregnancies, second trimester: Secondary | ICD-10-CM

## 2020-02-01 MED ORDER — HYDROXYPROGESTERONE CAPROATE 250 MG/ML IM OIL
250.0000 mg | TOPICAL_OIL | Freq: Once | INTRAMUSCULAR | Status: AC
  Administered 2020-02-01: 250 mg via INTRAMUSCULAR

## 2020-02-01 NOTE — Progress Notes (Signed)
Pt here for hydroxyprogesterone inj which was given IM right glut.  NDC# [id number].

## 2020-02-08 ENCOUNTER — Ambulatory Visit: Payer: Self-pay

## 2020-02-08 ENCOUNTER — Ambulatory Visit (INDEPENDENT_AMBULATORY_CARE_PROVIDER_SITE_OTHER): Payer: Medicaid Other

## 2020-02-08 ENCOUNTER — Other Ambulatory Visit: Payer: Self-pay

## 2020-02-08 DIAGNOSIS — O09892 Supervision of other high risk pregnancies, second trimester: Secondary | ICD-10-CM | POA: Diagnosis not present

## 2020-02-08 MED ORDER — HYDROXYPROGESTERONE CAPROATE 250 MG/ML IM OIL
250.0000 mg | TOPICAL_OIL | Freq: Once | INTRAMUSCULAR | Status: AC
  Administered 2020-02-08: 250 mg via INTRAMUSCULAR

## 2020-02-08 NOTE — Patient Instructions (Signed)
Pt received Makena in LUOQ. Tolerated well

## 2020-02-15 ENCOUNTER — Ambulatory Visit (INDEPENDENT_AMBULATORY_CARE_PROVIDER_SITE_OTHER): Payer: Medicaid Other | Admitting: Obstetrics and Gynecology

## 2020-02-15 ENCOUNTER — Other Ambulatory Visit: Payer: Self-pay

## 2020-02-15 VITALS — BP 122/66 | Wt 191.0 lb

## 2020-02-15 DIAGNOSIS — O26899 Other specified pregnancy related conditions, unspecified trimester: Secondary | ICD-10-CM

## 2020-02-15 DIAGNOSIS — O09892 Supervision of other high risk pregnancies, second trimester: Secondary | ICD-10-CM | POA: Diagnosis not present

## 2020-02-15 DIAGNOSIS — O0990 Supervision of high risk pregnancy, unspecified, unspecified trimester: Secondary | ICD-10-CM

## 2020-02-15 DIAGNOSIS — O09299 Supervision of pregnancy with other poor reproductive or obstetric history, unspecified trimester: Secondary | ICD-10-CM

## 2020-02-15 DIAGNOSIS — Z3A22 22 weeks gestation of pregnancy: Secondary | ICD-10-CM

## 2020-02-15 DIAGNOSIS — Z6791 Unspecified blood type, Rh negative: Secondary | ICD-10-CM

## 2020-02-15 MED ORDER — HYDROXYPROGESTERONE CAPROATE 250 MG/ML IM OIL
250.0000 mg | TOPICAL_OIL | Freq: Once | INTRAMUSCULAR | Status: AC
  Administered 2020-02-15: 250 mg via INTRAMUSCULAR

## 2020-02-15 NOTE — Progress Notes (Signed)
ROB Makena injection today

## 2020-02-15 NOTE — Progress Notes (Signed)
Routine Prenatal Care Visit  Subjective  Linda Choi is a 24 y.o. [phone] at [redacted]w[redacted]d being seen today for ongoing prenatal care.  She is currently monitored for the following issues for this high-risk pregnancy and has History of pregnancy loss in prior pregnancy, currently pregnant in first trimester; Supervision of high risk pregnancy, antepartum; Rh negative state in antepartum period; History of macrosomia in infant in prior pregnancy, currently pregnant; Pregnancy; and UTI (urinary tract infection) on their problem list.  ----------------------------------------------------------------------------------- Patient reports no complaints.   Contractions: Not present. Vag. Bleeding: None.  Movement: Present. Denies leaking of fluid.  ----------------------------------------------------------------------------------- The following portions of the patient's history were reviewed and updated as appropriate: allergies, current medications, past family history, past medical history, past social history, past surgical history and problem list. Problem list updated.   Objective  Blood pressure 122/66, weight 191 lb (86.6 kg), last menstrual period 09/05/2019. Pregravid weight 156 lb (70.8 kg) Total Weight Gain 35 lb (15.9 kg) Urinalysis:      Fetal Status: Fetal Heart Rate (bpm): 150   Movement: Present     General:  Alert, oriented and cooperative. Patient is in no acute distress.  Skin: Skin is warm and dry. No rash noted.   Cardiovascular: Normal heart rate noted  Respiratory: Normal respiratory effort, no problems with respiration noted  Abdomen: Soft, gravid, appropriate for gestational age. Pain/Pressure: Absent     Pelvic:  Cervical exam deferred        Extremities: Normal range of motion.     ental Status: Normal mood and affect. Normal behavior. Normal judgment and thought content.     Assessment   24 y.o. [id number] at [redacted]w[redacted]d by  06/16/2020, by Ultrasound presenting for  routine prenatal visit  Plan   pregnancy 4 Problems (from 09/05/19 to present)    Problem Noted Resolved   History of macrosomia in infant in prior pregnancy, currently pregnant 01/23/2017 by Vena Austria, MD No   Overview Signed 01/23/2017 10:20 AM by Vena Austria, MD    [ ]  Growth scan at 36 weeks      History of pregnancy loss in prior pregnancy, currently pregnant in first trimester 10/03/2016 by 10/05/2016, CNM No   Overview Signed 10/03/2016 10:27 PM by 10/05/2016, CNM    Needs 17P injections starting at 16 weeks.      Supervision of high risk pregnancy, antepartum 10/03/2016 by 10/05/2016, CNM No   Overview Addendum 01/18/2020  2:32 PM by 01/20/2020, MD    [facility]inic [facility] Prenatal Labs  Dating LMP = 10 week Vena Austria Blood type: A/Negative/-- (05/18 1417)   Genetic Screen NIPS:nml XY Antibody:Negative (05/18 1417)  Anatomic [birth year]1981 Complete 01/18/2020 Rubella: 1.42 (05/18 1417) Varicella: Imm  GTT Third trimester:  RPR: Non Reactive (05/18 1417)   Rhogam needed HBsAg: Negative (05/18 1417)   TDaP vaccine             Flu Shot: HIV: Non Reactive (05/18 1417)   Baby Food Bottle                        GBS:   Contraception  Unsure, counseled as to Nexplanon and IUD Pap:11/08/19 NILM  CBB  no   CS/VBAC n/a   Support Person     Needs 17P injections starting at 16 weeks         Previous Version   Rh negative state in antepartum period 10/03/2016 by 10/05/2016, CNM No  Gestational age appropriate obstetric precautions including but not limited to vaginal bleeding, contractions, leaking of fluid and fetal movement were reviewed in detail with the patient.    Return in about 1 week (around 02/22/2020) for Weekly Makena injection, 4 weeks ROB.  Vena Austria, MD, [facility], [facility] 02/15/2020, 2:38 PM

## 2020-02-22 ENCOUNTER — Other Ambulatory Visit: Payer: Self-pay

## 2020-02-22 ENCOUNTER — Ambulatory Visit (INDEPENDENT_AMBULATORY_CARE_PROVIDER_SITE_OTHER): Payer: Medicaid Other

## 2020-02-22 DIAGNOSIS — O09892 Supervision of other high risk pregnancies, second trimester: Secondary | ICD-10-CM

## 2020-02-22 MED ORDER — HYDROXYPROGESTERONE CAPROATE 250 MG/ML IM OIL
250.0000 mg | TOPICAL_OIL | Freq: Once | INTRAMUSCULAR | Status: AC
  Administered 2020-02-22: 250 mg via INTRAMUSCULAR

## 2020-02-29 ENCOUNTER — Other Ambulatory Visit: Payer: Self-pay

## 2020-02-29 ENCOUNTER — Ambulatory Visit (INDEPENDENT_AMBULATORY_CARE_PROVIDER_SITE_OTHER): Payer: Medicaid Other

## 2020-02-29 DIAGNOSIS — O09892 Supervision of other high risk pregnancies, second trimester: Secondary | ICD-10-CM | POA: Diagnosis not present

## 2020-02-29 MED ORDER — HYDROXYPROGESTERONE CAPROATE 250 MG/ML IM OIL
250.0000 mg | TOPICAL_OIL | Freq: Once | INTRAMUSCULAR | Status: AC
  Administered 2020-02-29: 250 mg via INTRAMUSCULAR

## 2020-02-29 NOTE — Progress Notes (Signed)
Pt here from hydroxyprogesterone inj which was given IM right glut.  NDC# [id number]

## 2020-03-07 ENCOUNTER — Ambulatory Visit: Payer: Medicaid Other

## 2020-03-08 ENCOUNTER — Other Ambulatory Visit: Payer: Self-pay

## 2020-03-08 ENCOUNTER — Ambulatory Visit (INDEPENDENT_AMBULATORY_CARE_PROVIDER_SITE_OTHER): Payer: Medicaid Other

## 2020-03-08 DIAGNOSIS — O09892 Supervision of other high risk pregnancies, second trimester: Secondary | ICD-10-CM | POA: Diagnosis not present

## 2020-03-08 MED ORDER — HYDROXYPROGESTERONE CAPROATE 250 MG/ML IM OIL
250.0000 mg | TOPICAL_OIL | Freq: Once | INTRAMUSCULAR | Status: AC
  Administered 2020-03-08: 250 mg via INTRAMUSCULAR

## 2020-03-08 NOTE — Progress Notes (Signed)
Pt here for hydroxyprogesterone inj which was given IM right glut.  NDC# 71225-105-01 

## 2020-03-14 ENCOUNTER — Ambulatory Visit (INDEPENDENT_AMBULATORY_CARE_PROVIDER_SITE_OTHER): Payer: Medicaid Other | Admitting: Obstetrics and Gynecology

## 2020-03-14 ENCOUNTER — Encounter: Payer: Self-pay | Admitting: Obstetrics and Gynecology

## 2020-03-14 ENCOUNTER — Other Ambulatory Visit: Payer: Self-pay

## 2020-03-14 VITALS — BP 120/74 | Wt 202.0 lb

## 2020-03-14 DIAGNOSIS — O26899 Other specified pregnancy related conditions, unspecified trimester: Secondary | ICD-10-CM

## 2020-03-14 DIAGNOSIS — Z3A26 26 weeks gestation of pregnancy: Secondary | ICD-10-CM

## 2020-03-14 DIAGNOSIS — O0992 Supervision of high risk pregnancy, unspecified, second trimester: Secondary | ICD-10-CM

## 2020-03-14 DIAGNOSIS — O26892 Other specified pregnancy related conditions, second trimester: Secondary | ICD-10-CM

## 2020-03-14 DIAGNOSIS — N949 Unspecified condition associated with female genital organs and menstrual cycle: Secondary | ICD-10-CM

## 2020-03-14 DIAGNOSIS — Z6791 Unspecified blood type, Rh negative: Secondary | ICD-10-CM

## 2020-03-14 DIAGNOSIS — O09299 Supervision of pregnancy with other poor reproductive or obstetric history, unspecified trimester: Secondary | ICD-10-CM

## 2020-03-14 MED ORDER — HYDROXYPROGESTERONE CAPROATE 250 MG/ML IM OIL
250.0000 mg | TOPICAL_OIL | Freq: Once | INTRAMUSCULAR | Status: AC
  Administered 2020-03-14: 250 mg via INTRAMUSCULAR

## 2020-03-14 NOTE — Addendum Note (Signed)
Addended by: Liliane Shi on: 03/14/2020 03:53 PM   Modules accepted: Orders

## 2020-03-14 NOTE — Progress Notes (Signed)
Routine Prenatal Care Visit  Subjective  Linda Choi is a 24 y.o. [phone] at [redacted]w[redacted]d being seen today for ongoing prenatal care.  She is currently monitored for the following issues for this high-risk pregnancy and has History of pregnancy loss in prior pregnancy, currently pregnant in first trimester; Supervision of high risk pregnancy, antepartum; Rh negative state in antepartum period; History of macrosomia in infant in prior pregnancy, currently pregnant; Pregnancy; and UTI (urinary tract infection) on their problem list.  ----------------------------------------------------------------------------------- Patient reports pelvic pressure. No urinary sx, no vaginal symptoms apart from more watery discharge. Denies vaginal irritative sx. .   Contractions: Not present. Vag. Bleeding: None.  Movement: Present. Leaking Fluid denies.  ----------------------------------------------------------------------------------- The following portions of the patient's history were reviewed and updated as appropriate: allergies, current medications, past family history, past medical history, past social history, past surgical history and problem list. Problem list updated.  Objective  Blood pressure 120/74, weight 202 lb (91.6 kg), last menstrual period 09/05/2019. Pregravid weight 156 lb (70.8 kg) Total Weight Gain 46 lb (20.9 kg) Urinalysis: Urine Protein    Urine Glucose    Fetal Status: Fetal Heart Rate (bpm): 140 Fundal Height: 28 cm Movement: Present     General:  Alert, oriented and cooperative. Patient is in no acute distress.  Skin: Skin is warm and dry. No rash noted.   Cardiovascular: Normal heart rate noted  Respiratory: Normal respiratory effort, no problems with respiration noted  Abdomen: Soft, gravid, appropriate for gestational age. Pain/Pressure: Present     Pelvic:  Cervical exam performed Dilation: Closed Effacement (%): 50 Station: -3  Extremities: Normal range of motion.  Edema:  None  Mental Status: Normal mood and affect. Normal behavior. Normal judgment and thought content.   Wet Prep: PH: 4.5 Clue Cells: Negative Fungal elements: Negative Trichomonas: Negative   Assessment   24 y.o. [id number] at [redacted]w[redacted]d by  06/16/2020, by Ultrasound presenting for routine prenatal visit  Plan   pregnancy 4 Problems (from 09/05/19 to present)    Problem Noted Resolved   History of macrosomia in infant in prior pregnancy, currently pregnant 01/23/2017 by Vena Austria, MD No   Overview Signed 01/23/2017 10:20 AM by Vena Austria, MD    [ ]  Growth scan at 36 weeks      History of pregnancy loss in prior pregnancy, currently pregnant in first trimester 10/03/2016 by 10/05/2016, CNM No   Overview Signed 10/03/2016 10:27 PM by 10/05/2016, CNM    Needs 17P injections starting at 16 weeks.      Supervision of high risk pregnancy, antepartum 10/03/2016 by 10/05/2016, CNM No   Overview Addendum 01/18/2020  2:32 PM by 01/20/2020, MD    [facility]inic [facility] Prenatal Labs  Dating LMP = 10 week Vena Austria Blood type: A/Negative/-- (05/18 1417)   Genetic Screen NIPS:nml XY Antibody:Negative (05/18 1417)  Anatomic 10-30[birth year]1981 Complete 01/18/2020 Rubella: 1.42 (05/18 1417) Varicella: Imm  GTT Third trimester:  RPR: Non Reactive (05/18 1417)   Rhogam needed HBsAg: Negative (05/18 1417)   TDaP vaccine             Flu Shot: HIV: Non Reactive (05/18 1417)   Baby Food Bottle                        GBS:   Contraception  Unsure, counseled as to Nexplanon and IUD Pap:11/08/19 NILM  CBB  no   CS/VBAC n/a   Support Person  Needs 17P injections starting at 16 weeks         Previous Version   Rh negative state in antepartum period 10/03/2016 by Farrel Conners, CNM No       Preterm labor symptoms and general obstetric precautions including but not limited to vaginal bleeding, contractions, leaking of fluid and fetal movement were reviewed in detail with the  patient. Please refer to After Visit Summary for other counseling recommendations.   - NuSwab sent, urine culture - 28 week labs with Rhogam in 1 week.  Return in about 1 week (around 03/21/2020) for 28 week labs, rhogam, and Routine Prenatal Appointment.  Thomasene Mohair, MD, [facility], [facility] 03/14/2020 3:31 PM

## 2020-03-16 LAB — NUSWAB VAGINITIS PLUS (VG+)
Candida albicans, NAA: NEGATIVE
Candida glabrata, NAA: NEGATIVE
Chlamydia trachomatis, NAA: NEGATIVE
Megasphaera 1: HIGH Score — AB
Neisseria gonorrhoeae, NAA: NEGATIVE
Trich vag by NAA: NEGATIVE

## 2020-03-21 ENCOUNTER — Other Ambulatory Visit: Payer: Medicaid Other

## 2020-03-21 ENCOUNTER — Other Ambulatory Visit: Payer: Self-pay

## 2020-03-21 ENCOUNTER — Ambulatory Visit (INDEPENDENT_AMBULATORY_CARE_PROVIDER_SITE_OTHER): Payer: Medicaid Other | Admitting: Obstetrics & Gynecology

## 2020-03-21 ENCOUNTER — Encounter: Payer: Self-pay | Admitting: Obstetrics & Gynecology

## 2020-03-21 VITALS — BP 120/80 | Wt 202.0 lb

## 2020-03-21 DIAGNOSIS — O09892 Supervision of other high risk pregnancies, second trimester: Secondary | ICD-10-CM

## 2020-03-21 DIAGNOSIS — O0993 Supervision of high risk pregnancy, unspecified, third trimester: Secondary | ICD-10-CM

## 2020-03-21 DIAGNOSIS — O26899 Other specified pregnancy related conditions, unspecified trimester: Secondary | ICD-10-CM

## 2020-03-21 DIAGNOSIS — O0992 Supervision of high risk pregnancy, unspecified, second trimester: Secondary | ICD-10-CM

## 2020-03-21 DIAGNOSIS — Z3A27 27 weeks gestation of pregnancy: Secondary | ICD-10-CM | POA: Diagnosis not present

## 2020-03-21 DIAGNOSIS — Z6791 Unspecified blood type, Rh negative: Secondary | ICD-10-CM | POA: Diagnosis not present

## 2020-03-21 MED ORDER — HYDROXYPROGESTERONE CAPROATE 250 MG/ML IM OIL
250.0000 mg | TOPICAL_OIL | Freq: Once | INTRAMUSCULAR | Status: AC
  Administered 2020-03-21: 250 mg via INTRAMUSCULAR

## 2020-03-21 MED ORDER — RHO D IMMUNE GLOBULIN 1500 UNIT/2ML IJ SOSY
300.0000 ug | PREFILLED_SYRINGE | Freq: Once | INTRAMUSCULAR | Status: AC
  Administered 2020-03-21: 300 ug via INTRAMUSCULAR

## 2020-03-21 NOTE — Addendum Note (Signed)
Addended by: Cornelius Moras D on: 03/21/2020 03:32 PM   Modules accepted: Orders

## 2020-03-21 NOTE — Patient Instructions (Signed)
Rh0 [D] Immune Globulin injection What is this medicine? RhO [D] IMMUNE GLOBULIN (i MYOON GLOB yoo lin) is used to treat idiopathic thrombocytopenic purpura (ITP). This medicine is used in RhO negative mothers who are pregnant with a RhO positive child. It is also used after a transfusion of RhO positive blood into a RhO negative person. This medicine may be used for other purposes; ask your health care provider or pharmacist if you have questions. COMMON BRAND NAME(S): BayRho-D, HyperRHO S/D, MICRhoGAM, RhoGAM, Rhophylac, WinRho SDF What should I tell my health care provider before I take this medicine? They need to know if you have any of these conditions:  bleeding disorders  low levels of immunoglobulin A in the body  no spleen  an unusual or allergic reaction to human immune globulin, other medicines, foods, dyes, or preservatives  pregnant or trying to get pregnant  breast-feeding How should I use this medicine? This medicine is for injection into a muscle or into a vein. It is given by a health care professional in a hospital or clinic setting. Talk to your pediatrician regarding the use of this medicine in children. This medicine is not approved for use in children. Overdosage: If you think you have taken too much of this medicine contact a poison control center or emergency room at once. NOTE: This medicine is only for you. Do not share this medicine with others. What if I miss a dose? It is important not to miss your dose. Call your doctor or health care professional if you are unable to keep an appointment. What may interact with this medicine?  live virus vaccines, like measles, mumps, or rubella This list may not describe all possible interactions. Give your health care provider a list of all the medicines, herbs, non-prescription drugs, or dietary supplements you use. Also tell them if you smoke, drink alcohol, or use illegal drugs. Some items may interact with your  medicine. What should I watch for while using this medicine? This medicine is made from human blood. It may be possible to pass an infection in this medicine. Talk to your doctor about the risks and benefits of this medicine. This medicine may interfere with live virus vaccines. Before you get live virus vaccines tell your health care professional if you have received this medicine within the past 3 months. What side effects may I notice from receiving this medicine? Side effects that you should report to your doctor or health care professional as soon as possible:  allergic reactions like skin rash, itching or hives, swelling of the face, lips, or tongue  breathing problems  chest pain or tightness  yellowing of the eyes or skin Side effects that usually do not require medical attention (report to your doctor or health care professional if they continue or are bothersome):  fever  pain and tenderness at site where injected This list may not describe all possible side effects. Call your doctor for medical advice about side effects. You may report side effects to FDA at 1-800-FDA-1088. Where should I keep my medicine? This drug is given in a hospital or clinic and will not be stored at home. NOTE: This sheet is a summary. It may not cover all possible information. If you have questions about this medicine, talk to your doctor, pharmacist, or health care provider.  2020 Elsevier/Gold Standard (2008-02-22 14:06:10)

## 2020-03-21 NOTE — Addendum Note (Signed)
Addended by: Cornelius Moras D on: 03/21/2020 02:51 PM   Modules accepted: Orders

## 2020-03-21 NOTE — Progress Notes (Signed)
  Subjective  Fetal Movement? yes Contractions? no Leaking Fluid? no Vaginal Bleeding? no  Objective  BP 120/80   Wt 202 lb (91.6 kg)   LMP 09/05/2019 (LMP Unknown)   BMI 34.67 kg/m  General: NAD Pumonary: no increased work of breathing Abdomen: gravid, non-tender Extremities: no edema Psychiatric: mood appropriate, affect full  Assessment  24 y.o. [id number] at [redacted]w[redacted]d by  06/16/2020, by Ultrasound presenting for routine prenatal visit  Plan   Problem List Items Addressed This Visit      Other   Supervision of high risk pregnancy, antepartum   Rh negative state in antepartum period   Pregnancy - Primary    Other Visit Diagnoses    History of preterm delivery, currently pregnant in second trimester          pregnancy 4 Problems (from 09/05/19 to present)    Problem Noted Resolved   History of macrosomia in infant in prior pregnancy, currently pregnant Normal measurements today, reassured    Overview Signed 01/23/2017 10:20 AM by Vena Austria, MD    [ ]  Growth scan at 36 weeks      History of pregnancy loss in prior pregnancy, currently pregnant in first trimester Makena today and weekly    Overview Signed 10/03/2016 10:27 PM by 10/05/2016, CNM    Needs 17P injections starting at 16 weeks.      Supervision of high risk pregnancy, antepartum PNV, [facility]    Overview Addendum 01/18/2020  2:32 PM by 01/20/2020, MD    [facility]inic [facility] Prenatal Labs  Dating LMP = 10 week Vena Austria Blood type: A/Negative/-- (05/18 1417)   Genetic Screen NIPS:nml XY Antibody:Negative (05/18 1417)  Anatomic 10-30-1981 Complete 01/18/2020 Rubella: 1.42 (05/18 1417) Varicella: Imm  GTT Third trimester:  RPR: Non Reactive (05/18 1417)   Rhogam TODAY HBsAg: Negative (05/18 1417)   TDaP vaccine             Flu Shot: HIV: Non Reactive (05/18 1417)   Baby Food Bottle                        GBS:   Contraception  Unsure, counseled as to Nexplanon and IUD Pap:11/08/19 NILM  CBB  no   CS/VBAC n/a    Support Person     Needs 17P injections starting at 16 weeks         Previous Version   Rh negative state in antepartum period [facility] TODAY        [facility], MD, [facility], [facility] 03/21/2020  2:47 PM

## 2020-03-22 LAB — 28 WEEKS RH-PANEL
Antibody Screen: NEGATIVE
Basophils Absolute: 0 10*3/uL (ref 0.0–0.2)
Basos: 0 %
EOS (ABSOLUTE): 0.1 10*3/uL (ref 0.0–0.4)
Eos: 1 %
Gestational Diabetes Screen: 140 mg/dL — ABNORMAL HIGH (ref 65–139)
HIV Screen 4th Generation wRfx: NONREACTIVE
Hematocrit: 30.3 % — ABNORMAL LOW (ref 34.0–46.6)
Hemoglobin: 10 g/dL — ABNORMAL LOW (ref 11.1–15.9)
Immature Grans (Abs): 0 10*3/uL (ref 0.0–0.1)
Immature Granulocytes: 0 %
Lymphocytes Absolute: 2.1 10*3/uL (ref 0.7–3.1)
Lymphs: 21 %
MCH: 27.7 pg (ref 26.6–33.0)
MCHC: 33 g/dL (ref 31.5–35.7)
MCV: 84 fL (ref 79–97)
Monocytes Absolute: 0.5 10*3/uL (ref 0.1–0.9)
Monocytes: 5 %
Neutrophils Absolute: 7.4 10*3/uL — ABNORMAL HIGH (ref 1.4–7.0)
Neutrophils: 73 %
Platelets: 152 10*3/uL (ref 150–450)
RBC: 3.61 x10E6/uL — ABNORMAL LOW (ref 3.77–5.28)
RDW: 12.5 % (ref 11.7–15.4)
RPR Ser Ql: NONREACTIVE
WBC: 10.2 10*3/uL (ref 3.4–10.8)

## 2020-03-23 ENCOUNTER — Other Ambulatory Visit: Payer: Self-pay | Admitting: Obstetrics and Gynecology

## 2020-03-23 DIAGNOSIS — O0993 Supervision of high risk pregnancy, unspecified, third trimester: Secondary | ICD-10-CM

## 2020-03-23 DIAGNOSIS — O99810 Abnormal glucose complicating pregnancy: Secondary | ICD-10-CM

## 2020-03-28 ENCOUNTER — Ambulatory Visit: Payer: Medicaid Other

## 2020-03-29 ENCOUNTER — Ambulatory Visit (INDEPENDENT_AMBULATORY_CARE_PROVIDER_SITE_OTHER): Payer: Medicaid Other

## 2020-03-29 ENCOUNTER — Other Ambulatory Visit: Payer: Self-pay

## 2020-03-29 ENCOUNTER — Other Ambulatory Visit: Payer: Medicaid Other

## 2020-03-29 DIAGNOSIS — O99810 Abnormal glucose complicating pregnancy: Secondary | ICD-10-CM

## 2020-03-29 DIAGNOSIS — O09291 Supervision of pregnancy with other poor reproductive or obstetric history, first trimester: Secondary | ICD-10-CM | POA: Diagnosis not present

## 2020-03-29 DIAGNOSIS — O0993 Supervision of high risk pregnancy, unspecified, third trimester: Secondary | ICD-10-CM

## 2020-03-29 MED ORDER — HYDROXYPROGESTERONE CAPROATE 250 MG/ML IM OIL
250.0000 mg | TOPICAL_OIL | Freq: Once | INTRAMUSCULAR | Status: AC
  Administered 2020-03-29: 250 mg via INTRAMUSCULAR

## 2020-03-29 NOTE — Progress Notes (Signed)
Patient presents today for Hydroxyprogesterone injection within dates. Given IM LUOQ. Patient tolerated well. °

## 2020-03-30 LAB — GESTATIONAL GLUCOSE TOLERANCE
Glucose, Fasting: 87 mg/dL (ref 65–94)
Glucose, GTT - 1 Hour: 153 mg/dL (ref 65–179)
Glucose, GTT - 2 Hour: 140 mg/dL (ref 65–154)
Glucose, GTT - 3 Hour: 111 mg/dL (ref 65–139)

## 2020-04-04 ENCOUNTER — Encounter: Payer: Medicaid Other | Admitting: Advanced Practice Midwife

## 2020-04-07 ENCOUNTER — Encounter: Payer: Self-pay | Admitting: Advanced Practice Midwife

## 2020-04-07 ENCOUNTER — Ambulatory Visit (INDEPENDENT_AMBULATORY_CARE_PROVIDER_SITE_OTHER): Payer: Medicaid Other | Admitting: Advanced Practice Midwife

## 2020-04-07 ENCOUNTER — Other Ambulatory Visit: Payer: Self-pay

## 2020-04-07 VITALS — BP 114/70 | Wt 205.0 lb

## 2020-04-07 DIAGNOSIS — O0993 Supervision of high risk pregnancy, unspecified, third trimester: Secondary | ICD-10-CM

## 2020-04-07 DIAGNOSIS — O09293 Supervision of pregnancy with other poor reproductive or obstetric history, third trimester: Secondary | ICD-10-CM | POA: Diagnosis not present

## 2020-04-07 DIAGNOSIS — Z23 Encounter for immunization: Secondary | ICD-10-CM | POA: Diagnosis not present

## 2020-04-07 DIAGNOSIS — Z3A30 30 weeks gestation of pregnancy: Secondary | ICD-10-CM

## 2020-04-07 DIAGNOSIS — O09892 Supervision of other high risk pregnancies, second trimester: Secondary | ICD-10-CM

## 2020-04-07 LAB — POCT URINALYSIS DIPSTICK OB
Glucose, UA: NEGATIVE
POC,PROTEIN,UA: NEGATIVE

## 2020-04-07 NOTE — Progress Notes (Signed)
ROB- mid top back pain for the past 3 weeks, feels it more when lays down, flu/TDAP/BT consent today, 17P

## 2020-04-07 NOTE — Progress Notes (Signed)
Routine Prenatal Care Visit  Subjective  Linda Choi is a 24 y.o. [phone] at [redacted]w[redacted]d being seen today for ongoing prenatal care.  She is currently monitored for the following issues for this high-risk pregnancy and has History of pregnancy loss in prior pregnancy, currently pregnant in first trimester; Supervision of high risk pregnancy, antepartum; Rh negative state in antepartum period; History of macrosomia in infant in prior pregnancy, currently pregnant; Pregnancy; and UTI (urinary tract infection) on their problem list.  ----------------------------------------------------------------------------------- Patient reports mid right backache. She denies any injury or trauma or urinary symptoms.  Contractions: Not present. Vag. Bleeding: None.  Movement: Present. Leaking Fluid denies.  ----------------------------------------------------------------------------------- The following portions of the patient's history were reviewed and updated as appropriate: allergies, current medications, past family history, past medical history, past social history, past surgical history and problem list. Problem list updated.  Objective  Blood pressure 114/70, weight 205 lb (93 kg), last menstrual period 09/05/2019. Pregravid weight 156 lb (70.8 kg) Total Weight Gain 49 lb (22.2 kg) Urinalysis: Urine Protein    Urine Glucose    Fetal Status: Fetal Heart Rate (bpm): 145 Fundal Height: 33 cm Movement: Present     General:  Alert, oriented and cooperative. Patient is in no acute distress.  Skin: Skin is warm and dry. No rash noted.   Cardiovascular: Normal heart rate noted  Respiratory: Normal respiratory effort, no problems with respiration noted  Abdomen: Soft, gravid, appropriate for gestational age. Pain/Pressure: Absent     Pelvic:  Cervical exam deferred        Extremities: Normal range of motion.  Edema: None  Mental Status: Normal mood and affect. Normal behavior. Normal judgment and thought  content.   Assessment   24 y.o. [id number] at [redacted]w[redacted]d by  06/16/2020, by Ultrasound presenting for routine prenatal visit  Plan   pregnancy 4 Problems (from 09/05/19 to present)    Problem Noted Resolved   History of macrosomia in infant in prior pregnancy, currently pregnant 01/23/2017 by Vena Austria, MD No   Overview Signed 01/23/2017 10:20 AM by Vena Austria, MD    [ ]  Growth scan at 36 weeks      History of pregnancy loss in prior pregnancy, currently pregnant in first trimester 10/03/2016 by 10/05/2016, CNM No   Overview Signed 10/03/2016 10:27 PM by 10/05/2016, CNM    Needs 17P injections starting at 16 weeks.      Supervision of high risk pregnancy, antepartum 10/03/2016 by 10/05/2016, CNM No   Overview Addendum 01/18/2020  2:32 PM by 01/20/2020, MD    [facility] Prenatal Labs  Dating LMP = 10 week Vena Austria Blood type: A/Negative/-- (05/18 1417)   Genetic Screen NIPS:nml XY Antibody:Negative (05/18 1417)  Anatomic [birth year]1981 Complete 01/18/2020 Rubella: 1.42 (05/18 1417) Varicella: Imm  GTT Third trimester:  RPR: Non Reactive (05/18 1417)   Rhogam 03/21/20 HBsAg: Negative (05/18 1417)   TDaP vaccine 04/07/20            Flu Shot: 04/07/20 HIV: Non Reactive (05/18 1417)   Baby Food Bottle                        GBS:   Contraception  Unsure, counseled as to Nexplanon and IUD Pap:11/08/19 NILM  CBB  no   CS/VBAC n/a   Support Person     Needs 17P injections starting at 16 weeks         Previous Version   Rh  negative state in antepartum period 10/03/2016 by Farrel Conners, CNM No    Received 17P today  Reviewed comfort measures for back pain and return for worsening symptoms   Preterm labor symptoms and general obstetric precautions including but not limited to vaginal bleeding, contractions, leaking of fluid and fetal movement were reviewed in detail with the patient. Please refer to After Visit Summary for other counseling recommendations.    Return in about 2 weeks (around 04/21/2020) for rob.  Tresea Mall, CNM 04/07/2020 5:06 PM

## 2020-04-07 NOTE — Patient Instructions (Signed)

## 2020-04-10 MED ORDER — HYDROXYPROGESTERONE CAPROATE 250 MG/ML IM OIL
250.0000 mg | TOPICAL_OIL | Freq: Once | INTRAMUSCULAR | Status: AC
  Administered 2020-04-07: 250 mg via INTRAMUSCULAR

## 2020-04-10 NOTE — Addendum Note (Signed)
Addended by: Donnetta Hail on: 04/10/2020 10:50 AM   Modules accepted: Orders

## 2020-04-11 ENCOUNTER — Ambulatory Visit (INDEPENDENT_AMBULATORY_CARE_PROVIDER_SITE_OTHER): Payer: Medicaid Other

## 2020-04-11 ENCOUNTER — Other Ambulatory Visit: Payer: Self-pay

## 2020-04-11 DIAGNOSIS — O09893 Supervision of other high risk pregnancies, third trimester: Secondary | ICD-10-CM | POA: Diagnosis not present

## 2020-04-11 MED ORDER — HYDROXYPROGESTERONE CAPROATE 250 MG/ML IM OIL
250.0000 mg | TOPICAL_OIL | Freq: Once | INTRAMUSCULAR | Status: AC
  Administered 2020-04-11: 250 mg via INTRAMUSCULAR

## 2020-04-11 NOTE — Patient Instructions (Signed)
17P given in LUOQ, pt tolerated well

## 2020-04-18 ENCOUNTER — Other Ambulatory Visit: Payer: Self-pay

## 2020-04-18 ENCOUNTER — Ambulatory Visit (INDEPENDENT_AMBULATORY_CARE_PROVIDER_SITE_OTHER): Payer: Medicaid Other | Admitting: Obstetrics & Gynecology

## 2020-04-18 ENCOUNTER — Encounter: Payer: Self-pay | Admitting: Obstetrics & Gynecology

## 2020-04-18 VITALS — BP 118/64 | Wt 208.0 lb

## 2020-04-18 DIAGNOSIS — O09291 Supervision of pregnancy with other poor reproductive or obstetric history, first trimester: Secondary | ICD-10-CM

## 2020-04-18 DIAGNOSIS — O09893 Supervision of other high risk pregnancies, third trimester: Secondary | ICD-10-CM

## 2020-04-18 DIAGNOSIS — O0993 Supervision of high risk pregnancy, unspecified, third trimester: Secondary | ICD-10-CM

## 2020-04-18 DIAGNOSIS — Z3A31 31 weeks gestation of pregnancy: Secondary | ICD-10-CM

## 2020-04-18 DIAGNOSIS — O26899 Other specified pregnancy related conditions, unspecified trimester: Secondary | ICD-10-CM

## 2020-04-18 DIAGNOSIS — Z6791 Unspecified blood type, Rh negative: Secondary | ICD-10-CM

## 2020-04-18 LAB — POCT URINALYSIS DIPSTICK OB
Glucose, UA: NEGATIVE
POC,PROTEIN,UA: NEGATIVE

## 2020-04-18 MED ORDER — HYDROXYPROGESTERONE CAPROATE 250 MG/ML IM OIL
250.0000 mg | TOPICAL_OIL | Freq: Once | INTRAMUSCULAR | Status: AC
  Administered 2020-04-18: 250 mg via INTRAMUSCULAR

## 2020-04-18 NOTE — Progress Notes (Signed)
°  Subjective  Fetal Movement? yes Contractions? no Leaking Fluid? no Vaginal Bleeding? no  Objective  BP 118/64    Wt 208 lb (94.3 kg)    LMP 09/05/2019 (LMP Unknown)    BMI 35.70 kg/m  General: NAD Pumonary: no increased work of breathing Abdomen: gravid, non-tender Extremities: no edema Psychiatric: mood appropriate, affect full  Assessment  24 y.o. [id number] at [redacted]w[redacted]d by  06/16/2020, by Ultrasound presenting for routine prenatal visit  Plan   Problem List Items Addressed This Visit      Other   Pregnancy - Primary   Relevant Orders   POC Urinalysis Dipstick OB      pregnancy 4 Problems (from 09/05/19 to present)    Problem Noted Resolved   History of macrosomia in infant in prior pregnancy, currently pregnant 01/23/2017 by Vena Austria, MD No   Overview Signed 01/23/2017 10:20 AM by Vena Austria, MD    [ ]  Growth scan at 36 weeks      History of pregnancy loss in prior pregnancy, currently pregnant in first trimester 10/03/2016 by 10/05/2016, CNM No   Overview Signed 10/03/2016 10:27 PM by 10/05/2016, CNM    Needs 17P injections starting at 16 weeks.      Supervision of high risk pregnancy, antepartum 10/03/2016 by 10/05/2016, CNM No   Overview Addendum 04/07/2020  5:10 PM by 06/07/2020, CNM    [facility] Prenatal Labs  Dating LMP = 10 week Tresea Mall Blood type: A/Negative/-- (05/18 1417)   Genetic Screen NIPS:nml XY Antibody:Negative (05/18 1417)  Anatomic 10-30-1981 Complete 01/18/2020 Rubella: 1.42 (05/18 1417) Varicella: Imm  GTT Third trimester:  RPR: Non Reactive (05/18 1417)   Rhogam 03/21/20 HBsAg: Negative (05/18 1417)   TDaP vaccine 04/07/20             Flu Shot: 10/1/121 HIV: Non Reactive (05/18 1417)   Baby Food Bottle                        GBS:   Contraception  Unsure, counseled as to Nexplanon and IUD Pap:11/08/19 NILM  CBB  no   CS/VBAC n/a   Support Person     Needs 17P injections starting at 16 weeks         Previous  Version   Rh negative state in antepartum period 10/03/2016 by 10/05/2016, CNM No     PNV, [facility], Preterm Labor precautions  DIscussed macrosomia and delivery  [facility], MD, [facility], [facility] 04/18/2020  1:42 PM

## 2020-04-18 NOTE — Patient Instructions (Signed)
Third Trimester of Pregnancy The third trimester is from week 28 through week 40 (months 7 through 9). The third trimester is a time when the unborn baby (fetus) is growing rapidly. At the end of the ninth month, the fetus is about 20 inches in length and weighs 6-10 pounds. Body changes during your third trimester Your body will continue to go through many changes during pregnancy. The changes vary from woman to woman. During the third trimester:  Your weight will continue to increase. You can expect to gain 25-35 pounds (11-16 kg) by the end of the pregnancy.  You may begin to get stretch marks on your hips, abdomen, and breasts.  You may urinate more often because the fetus is moving lower into your pelvis and pressing on your bladder.  You may develop or continue to have heartburn. This is caused by increased hormones that slow down muscles in the digestive tract.  You may develop or continue to have constipation because increased hormones slow digestion and cause the muscles that push waste through your intestines to relax.  You may develop hemorrhoids. These are swollen veins (varicose veins) in the rectum that can itch or be painful.  You may develop swollen, bulging veins (varicose veins) in your legs.  You may have increased body aches in the pelvis, back, or thighs. This is due to weight gain and increased hormones that are relaxing your joints.  You may have changes in your hair. These can include thickening of your hair, rapid growth, and changes in texture. Some women also have hair loss during or after pregnancy, or hair that feels dry or thin. Your hair will most likely return to normal after your baby is born.  Your breasts will continue to grow and they will continue to become tender. A yellow fluid (colostrum) may leak from your breasts. This is the first milk you are producing for your baby.  Your belly button may stick out.  You may notice more swelling in your hands,  face, or ankles.  You may have increased tingling or numbness in your hands, arms, and legs. The skin on your belly may also feel numb.  You may feel short of breath because of your expanding uterus.  You may have more problems sleeping. This can be caused by the size of your belly, increased need to urinate, and an increase in your body's metabolism.  You may notice the fetus "dropping," or moving lower in your abdomen (lightening).  You may have increased vaginal discharge.  You may notice your joints feel loose and you may have pain around your pelvic bone. What to expect at prenatal visits You will have prenatal exams every 2 weeks until week 36. Then you will have weekly prenatal exams. During a routine prenatal visit:  You will be weighed to make sure you and the baby are growing normally.  Your blood pressure will be taken.  Your abdomen will be measured to track your baby's growth.  The fetal heartbeat will be listened to.  Any test results from the previous visit will be discussed.  You may have a cervical check near your due date to see if your cervix has softened or thinned (effaced).  You will be tested for Group B streptococcus. This happens between 35 and 37 weeks. Your health care provider may ask you:  What your birth plan is.  How you are feeling.  If you are feeling the baby move.  If you have had any abnormal   symptoms, such as leaking fluid, bleeding, severe headaches, or abdominal cramping.  If you are using any tobacco products, including cigarettes, chewing tobacco, and electronic cigarettes.  If you have any questions. Other tests or screenings that may be performed during your third trimester include:  Blood tests that check for low iron levels (anemia).  Fetal testing to check the health, activity level, and growth of the fetus. Testing is done if you have certain medical conditions or if there are problems during the pregnancy.  Nonstress test  (NST). This test checks the health of your baby to make sure there are no signs of problems, such as the baby not getting enough oxygen. During this test, a belt is placed around your belly. The baby is made to move, and its heart rate is monitored during movement. What is false labor? False labor is a condition in which you feel small, irregular tightenings of the muscles in the womb (contractions) that usually go away with rest, changing position, or drinking water. These are called Braxton Hicks contractions. Contractions may last for hours, days, or even weeks before true labor sets in. If contractions come at regular intervals, become more frequent, increase in intensity, or become painful, you should see your health care provider. What are the signs of labor?  Abdominal cramps.  Regular contractions that start at 10 minutes apart and become stronger and more frequent with time.  Contractions that start on the top of the uterus and spread down to the lower abdomen and back.  Increased pelvic pressure and dull back pain.  A watery or bloody mucus discharge that comes from the vagina.  Leaking of amniotic fluid. This is also known as your "water breaking." It could be a slow trickle or a gush. Let your health care provider know if it has a color or strange odor. If you have any of these signs, call your health care provider right away, even if it is before your due date. Follow these instructions at home: Medicines  Follow your health care provider's instructions regarding medicine use. Specific medicines may be either safe or unsafe to take during pregnancy.  Take a prenatal vitamin that contains at least 600 micrograms (mcg) of folic acid.  If you develop constipation, try taking a stool softener if your health care provider approves. Eating and drinking   Eat a balanced diet that includes fresh fruits and vegetables, whole grains, good sources of protein such as meat, eggs, or tofu,  and low-fat dairy. Your health care provider will help you determine the amount of weight gain that is right for you.  Avoid raw meat and uncooked cheese. These carry germs that can cause birth defects in the baby.  If you have low calcium intake from food, talk to your health care provider about whether you should take a daily calcium supplement.  Eat four or five small meals rather than three large meals a day.  Limit foods that are high in fat and processed sugars, such as fried and sweet foods.  To prevent constipation: ? Drink enough fluid to keep your urine clear or pale yellow. ? Eat foods that are high in fiber, such as fresh fruits and vegetables, whole grains, and beans. Activity  Exercise only as directed by your health care provider. Most women can continue their usual exercise routine during pregnancy. Try to exercise for 30 minutes at least 5 days a week. Stop exercising if you experience uterine contractions.  Avoid heavy lifting.  Do   not exercise in extreme heat or humidity, or at high altitudes.  Wear low-heel, comfortable shoes.  Practice good posture.  You may continue to have sex unless your health care provider tells you otherwise. Relieving pain and discomfort  Take frequent breaks and rest with your legs elevated if you have leg cramps or low back pain.  Take warm sitz baths to soothe any pain or discomfort caused by hemorrhoids. Use hemorrhoid cream if your health care provider approves.  Wear a good support bra to prevent discomfort from breast tenderness.  If you develop varicose veins: ? Wear support pantyhose or compression stockings as told by your healthcare provider. ? Elevate your feet for 15 minutes, 3-4 times a day. Prenatal care  Write down your questions. Take them to your prenatal visits.  Keep all your prenatal visits as told by your health care provider. This is important. Safety  Wear your seat belt at all times when driving.  Make  a list of emergency phone numbers, including numbers for family, friends, the hospital, and police and fire departments. General instructions  Avoid cat litter boxes and soil used by cats. These carry germs that can cause birth defects in the baby. If you have a cat, ask someone to clean the litter box for you.  Do not travel far distances unless it is absolutely necessary and only with the approval of your health care provider.  Do not use hot tubs, steam rooms, or saunas.  Do not drink alcohol.  Do not use any products that contain nicotine or tobacco, such as cigarettes and e-cigarettes. If you need help quitting, ask your health care provider.  Do not use any medicinal herbs or unprescribed drugs. These chemicals affect the formation and growth of the baby.  Do not douche or use tampons or scented sanitary pads.  Do not cross your legs for long periods of time.  To prepare for the arrival of your baby: ? Take prenatal classes to understand, practice, and ask questions about labor and delivery. ? Make a trial run to the hospital. ? Visit the hospital and tour the maternity area. ? Arrange for maternity or paternity leave through employers. ? Arrange for family and friends to take care of pets while you are in the hospital. ? Purchase a rear-facing car seat and make sure you know how to install it in your car. ? Pack your hospital bag. ? Prepare the baby's nursery. Make sure to remove all pillows and stuffed animals from the baby's crib to prevent suffocation.  Visit your dentist if you have not gone during your pregnancy. Use a soft toothbrush to brush your teeth and be gentle when you floss. Contact a health care provider if:  You are unsure if you are in labor or if your water has broken.  You become dizzy.  You have mild pelvic cramps, pelvic pressure, or nagging pain in your abdominal area.  You have lower back pain.  You have persistent nausea, vomiting, or  diarrhea.  You have an unusual or bad smelling vaginal discharge.  You have pain when you urinate. Get help right away if:  Your water breaks before 37 weeks.  You have regular contractions less than 5 minutes apart before 37 weeks.  You have a fever.  You are leaking fluid from your vagina.  You have spotting or bleeding from your vagina.  You have severe abdominal pain or cramping.  You have rapid weight loss or weight gain.  You have   shortness of breath with chest pain.  You notice sudden or extreme swelling of your face, hands, ankles, feet, or legs.  Your baby makes fewer than 10 movements in 2 hours.  You have severe headaches that do not go away when you take medicine.  You have vision changes. Summary  The third trimester is from week 28 through week 40, months 7 through 9. The third trimester is a time when the unborn baby (fetus) is growing rapidly.  During the third trimester, your discomfort may increase as you and your baby continue to gain weight. You may have abdominal, leg, and back pain, sleeping problems, and an increased need to urinate.  During the third trimester your breasts will keep growing and they will continue to become tender. A yellow fluid (colostrum) may leak from your breasts. This is the first milk you are producing for your baby.  False labor is a condition in which you feel small, irregular tightenings of the muscles in the womb (contractions) that eventually go away. These are called Braxton Hicks contractions. Contractions may last for hours, days, or even weeks before true labor sets in.  Signs of labor can include: abdominal cramps; regular contractions that start at 10 minutes apart and become stronger and more frequent with time; watery or bloody mucus discharge that comes from the vagina; increased pelvic pressure and dull back pain; and leaking of amniotic fluid. This information is not intended to replace advice given to you by your  health care provider. Make sure you discuss any questions you have with your health care provider. Document Revised: 10/15/2018 Document Reviewed: 07/30/2016 Elsevier Patient Education  2020 [employer]  

## 2020-04-25 ENCOUNTER — Other Ambulatory Visit: Payer: Self-pay

## 2020-04-25 ENCOUNTER — Ambulatory Visit (INDEPENDENT_AMBULATORY_CARE_PROVIDER_SITE_OTHER): Payer: Medicaid Other

## 2020-04-25 ENCOUNTER — Observation Stay
Admission: EM | Admit: 2020-04-25 | Discharge: 2020-04-25 | Disposition: A | Discharge status: Home / Self Care | Payer: Medicaid Other | Attending: Obstetrics & Gynecology | Admitting: Obstetrics & Gynecology

## 2020-04-25 ENCOUNTER — Encounter: Payer: Self-pay | Admitting: Obstetrics & Gynecology

## 2020-04-25 DIAGNOSIS — O99891 Other specified diseases and conditions complicating pregnancy: Secondary | ICD-10-CM | POA: Diagnosis not present

## 2020-04-25 DIAGNOSIS — O219 Vomiting of pregnancy, unspecified: Principal | ICD-10-CM | POA: Insufficient documentation

## 2020-04-25 DIAGNOSIS — O09291 Supervision of pregnancy with other poor reproductive or obstetric history, first trimester: Secondary | ICD-10-CM

## 2020-04-25 DIAGNOSIS — O0990 Supervision of high risk pregnancy, unspecified, unspecified trimester: Secondary | ICD-10-CM

## 2020-04-25 DIAGNOSIS — Z6791 Unspecified blood type, Rh negative: Secondary | ICD-10-CM

## 2020-04-25 DIAGNOSIS — O26899 Other specified pregnancy related conditions, unspecified trimester: Secondary | ICD-10-CM

## 2020-04-25 DIAGNOSIS — Z3A32 32 weeks gestation of pregnancy: Secondary | ICD-10-CM

## 2020-04-25 DIAGNOSIS — O09299 Supervision of pregnancy with other poor reproductive or obstetric history, unspecified trimester: Secondary | ICD-10-CM

## 2020-04-25 DIAGNOSIS — R112 Nausea with vomiting, unspecified: Secondary | ICD-10-CM | POA: Diagnosis not present

## 2020-04-25 DIAGNOSIS — O09211 Supervision of pregnancy with history of pre-term labor, first trimester: Secondary | ICD-10-CM

## 2020-04-25 DIAGNOSIS — R197 Diarrhea, unspecified: Secondary | ICD-10-CM | POA: Diagnosis not present

## 2020-04-25 LAB — CBC
HCT: 33.4 % — ABNORMAL LOW (ref 36.0–46.0)
Hemoglobin: 10.6 g/dL — ABNORMAL LOW (ref 12.0–15.0)
MCH: 25.4 pg — ABNORMAL LOW (ref 26.0–34.0)
MCHC: 31.7 g/dL (ref 30.0–36.0)
MCV: 80.1 fL (ref 80.0–100.0)
Platelets: 128 10*3/uL — ABNORMAL LOW (ref 150–400)
RBC: 4.17 MIL/uL (ref 3.87–5.11)
RDW: 14.3 % (ref 11.5–15.5)
WBC: 8.5 10*3/uL (ref 4.0–10.5)
nRBC: 0 % (ref 0.0–0.2)

## 2020-04-25 LAB — URINALYSIS, COMPLETE (UACMP) WITH MICROSCOPIC
Bilirubin Urine: NEGATIVE
Glucose, UA: NEGATIVE mg/dL
Hgb urine dipstick: NEGATIVE
Ketones, ur: NEGATIVE mg/dL
Nitrite: NEGATIVE
Protein, ur: NEGATIVE mg/dL
Specific Gravity, Urine: 1.021 (ref 1.005–1.030)
pH: 5 (ref 5.0–8.0)

## 2020-04-25 LAB — COMPREHENSIVE METABOLIC PANEL
ALT: 14 U/L (ref 0–44)
AST: 18 U/L (ref 15–41)
Albumin: 3.1 g/dL — ABNORMAL LOW (ref 3.5–5.0)
Alkaline Phosphatase: 111 U/L (ref 38–126)
Anion gap: 10 (ref 5–15)
BUN: 7 mg/dL (ref 6–20)
CO2: 20 mmol/L — ABNORMAL LOW (ref 22–32)
Calcium: 8.6 mg/dL — ABNORMAL LOW (ref 8.9–10.3)
Chloride: 106 mmol/L (ref 98–111)
Creatinine, Ser: 0.46 mg/dL (ref 0.44–1.00)
GFR, Estimated: >60 mL/min (ref >60)
Glucose, Bld: 121 mg/dL — ABNORMAL HIGH (ref 70–99)
Potassium: 3.5 mmol/L (ref 3.5–5.1)
Sodium: 136 mmol/L (ref 135–145)
Total Bilirubin: 0.6 mg/dL (ref 0.3–1.2)
Total Protein: 7.2 g/dL (ref 6.5–8.1)

## 2020-04-25 MED ORDER — HYDROXYPROGESTERONE CAPROATE 250 MG/ML IM OIL
250.0000 mg | TOPICAL_OIL | Freq: Once | INTRAMUSCULAR | Status: AC
  Administered 2020-04-25: 250 mg via INTRAMUSCULAR

## 2020-04-25 MED ORDER — HYDROXYPROGESTERONE CAPROATE 275 MG/1.1ML ~~LOC~~ SOAJ: 275.0000 mg | Freq: Once | SUBCUTANEOUS | Status: DC

## 2020-04-25 MED ORDER — ACETAMINOPHEN 325 MG PO TABS: 650.0000 mg | ORAL_TABLET | ORAL | Status: DC | PRN

## 2020-04-25 MED ORDER — ONDANSETRON HCL 4 MG/2ML IJ SOLN
4.0000 mg | Freq: Four times a day (QID) | INTRAMUSCULAR | Status: DC | PRN
  Administered 2020-04-25: 4 mg via INTRAVENOUS
  Filled 2020-04-25: qty 2

## 2020-04-25 MED ORDER — LACTATED RINGERS IV SOLN: INTRAVENOUS | Status: DC

## 2020-04-25 MED ORDER — LACTATED RINGERS IV BOLUS
1000.0000 mL | Freq: Once | INTRAVENOUS | Status: AC
  Administered 2020-04-25: 1000 mL via INTRAVENOUS

## 2020-04-25 NOTE — OB Triage Note (Addendum)
Pt is a G4P2 at [redacted]w[redacted]d that presents from ED with c/o Contractions, Nausea/Vomiting. Pt states she "threw up twice between 0945 and 1030 this morning." Pt states she "started having contractions from 0600-0800 this morning then they went away. The contractions started coming every 3 minutes around 1330 today." Pt states she has had 6 episodes of diarrhea this morning and states positive fetal movement, denies LOF, VB. Pt has hx of [redacted]w[redacted]d delivery and takes Makena injection. Vitals WDL. Provider notified of patient arrival to the unit.

## 2020-04-25 NOTE — Final Progress Note (Signed)
Physician Final Progress Note  Patient ID: Linda Choi MRN: [id number] DOB/AGE: 29-Mar-1996 24 y.o.  Admit date: 04/25/2020 Admitting provider: Nadara Mustard, MD Discharge date: 04/25/2020  Admission Diagnoses: [redacted] weeks EGA Nausea vomiting and diarrhea  Discharge Diagnoses:  Active Problems:   Nausea vomiting and diarrhea  Consults: None  Significant Findings/ Diagnostic Studies: Patient presented for evaluation of labor, as well as nausea vomiting and diarrhea today.  Patient had cervical exam by RN and this was reported to me. I reviewed her vital signs and fetal tracing, both of which were reassuring.  Labs reassuring.  No sign of PTL.  Probable food related gastroenteritis.  Patient was discharge as she was not laboring.  Procedures: A NST procedure was performed with FHR monitoring and a normal baseline established, appropriate time of 20-40 minutes of evaluation, and accels >2 seen w 15x15 characteristics.  Results show a REACTIVE NST.   Discharge Condition: good  Disposition: Discharge disposition: 01-Home or Self Care       Diet: Regular diet  Discharge Activity: Activity as tolerated   Allergies as of 04/25/2020   No Known Allergies     Medication List    TAKE these medications   hydroxyprogesterone caproate 250 mg/mL Oil injection Commonly known as: MAKENA Inject 1 mL (250 mg total) into the muscle once for 1 dose.   Prenate Pixie 10-0.6-0.4-200 MG Caps Take 1 capsule by mouth daily.        Total time spent taking care of this patient: TRIAGE  Signed: Letitia Libra 04/25/2020, 10:51 PM

## 2020-04-25 NOTE — Progress Notes (Signed)
Patient presents today for Hydroxyprogesterone injection within dates. Given IM LUOQ. Patient tolerated well. °

## 2020-04-25 NOTE — OB Triage Note (Signed)
Patient Discharged home per provider. Pt educated about labor precautions and informed when to return to the ED for further evaluation. Pt instructed to keep all follow up appointments with her provider. AVS given to patient and RN answered all questions and patient has no further questions at this time. Pt discharged home in stable condition. °

## 2020-04-25 NOTE — Discharge Summary (Signed)
  See FPN 

## 2020-05-02 ENCOUNTER — Other Ambulatory Visit: Payer: Self-pay

## 2020-05-02 ENCOUNTER — Ambulatory Visit (INDEPENDENT_AMBULATORY_CARE_PROVIDER_SITE_OTHER): Payer: Medicaid Other | Admitting: Advanced Practice Midwife

## 2020-05-02 ENCOUNTER — Encounter: Payer: Self-pay | Admitting: Advanced Practice Midwife

## 2020-05-02 VITALS — BP 114/80 | Wt 211.0 lb

## 2020-05-02 DIAGNOSIS — O09291 Supervision of pregnancy with other poor reproductive or obstetric history, first trimester: Secondary | ICD-10-CM | POA: Diagnosis not present

## 2020-05-02 DIAGNOSIS — Z3A33 33 weeks gestation of pregnancy: Secondary | ICD-10-CM

## 2020-05-02 DIAGNOSIS — O0993 Supervision of high risk pregnancy, unspecified, third trimester: Secondary | ICD-10-CM

## 2020-05-02 LAB — POCT URINALYSIS DIPSTICK OB
Glucose, UA: NEGATIVE
POC,PROTEIN,UA: NEGATIVE

## 2020-05-02 MED ORDER — HYDROXYPROGESTERONE CAPROATE 275 MG/1.1ML ~~LOC~~ SOAJ
275.0000 mg | Freq: Once | SUBCUTANEOUS | Status: AC
  Administered 2020-05-02: 275 mg via SUBCUTANEOUS

## 2020-05-02 NOTE — Progress Notes (Signed)
Lots of pressure and sharp pain all over stomach, no vb, no lof

## 2020-05-02 NOTE — Progress Notes (Signed)
Routine Prenatal Care Visit  Subjective  Linda Choi is a 24 y.o. [phone] at [redacted]w[redacted]d being seen today for ongoing prenatal care.  She is currently monitored for the following issues for this high-risk pregnancy and has History of pregnancy loss in prior pregnancy, currently pregnant in first trimester; Supervision of high risk pregnancy, antepartum; Rh negative state in antepartum period; History of macrosomia in infant in prior pregnancy, currently pregnant; Pregnancy; UTI (urinary tract infection); and Nausea vomiting and diarrhea on their problem list.  ----------------------------------------------------------------------------------- Patient reports abdominal pain and diarrhea continuing this week. She was seen a week ago at triage with the same. She has a history of macrosomic newborn and prefers vaginal delivery.   Contractions: Not present. Vag. Bleeding: None.  Movement: Present. Leaking Fluid denies.  ----------------------------------------------------------------------------------- The following portions of the patient's history were reviewed and updated as appropriate: allergies, current medications, past family history, past medical history, past social history, past surgical history and problem list. Problem list updated.  Objective  Blood pressure 114/80, weight 211 lb (95.7 kg), last menstrual period 09/05/2019. Pregravid weight 156 lb (70.8 kg) Total Weight Gain 55 lb (24.9 kg) Urinalysis: Urine Protein Negative  Urine Glucose Negative  Fetal Status: Fetal Heart Rate (bpm): 147 Fundal Height: 35 cm Movement: Present     General:  Alert, oriented and cooperative. Patient is in no acute distress.  Skin: Skin is warm and dry. No rash noted.   Cardiovascular: Normal heart rate noted  Respiratory: Normal respiratory effort, no problems with respiration noted  Abdomen: Soft, gravid, appropriate for gestational age. Pain/Pressure: Present     Pelvic:  Cervical exam performed  Dilation: Closed Effacement (%): 30  external os is 1 cm  Extremities: Normal range of motion.  Edema: None  Mental Status: Normal mood and affect. Normal behavior. Normal judgment and thought content.   Assessment   24 y.o. [id number] at [redacted]w[redacted]d by  06/16/2020, by Ultrasound presenting for routine prenatal visit  Plan   pregnancy 4 Problems (from 09/05/19 to present)    Problem Noted Resolved   History of macrosomia in infant in prior pregnancy, currently pregnant 01/23/2017 by Vena Austria, MD No   Overview Signed 01/23/2017 10:20 AM by Vena Austria, MD    [ ]  Growth scan at 36 weeks      History of pregnancy loss in prior pregnancy, currently pregnant in first trimester 10/03/2016 by 10/05/2016, CNM No   Overview Addendum 04/18/2020  1:46 PM by 06/18/2020, MD    17P injections starting at 16 weeks.      Previous Version   Supervision of high risk pregnancy, antepartum 10/03/2016 by 10/05/2016, CNM No   Overview Addendum 04/07/2020  5:10 PM by 06/07/2020, CNM    Clinic [facility] Prenatal Labs  Dating LMP = 10 week [place] Blood type: A/Negative/-- (05/18 1417)   Genetic Screen NIPS:nml XY Antibody:Negative (05/18 1417)  Anatomic 10-30-1981 Complete 01/18/2020 Rubella: 1.42 (05/18 1417) Varicella: Imm  GTT Third trimester:  RPR: Non Reactive (05/18 1417)   Rhogam 03/21/20 HBsAg: Negative (05/18 1417)   TDaP vaccine 04/07/20             Flu Shot: 10/1/121 HIV: Non Reactive (05/18 1417)   Baby Food Bottle                        GBS:   Contraception  Unsure, counseled as to Nexplanon and IUD Pap:11/08/19 NILM  CBB  no  CS/VBAC n/a   Support Person     Needs 17P injections starting at 16 weeks         Previous Version   Rh negative state in antepartum period 10/03/2016 by Farrel Conners, CNM No   Overview Signed 04/18/2020  1:45 PM by Nadara Mustard, MD    Rhogam 03/21/20          Preterm labor symptoms and general obstetric precautions including but not  limited to vaginal bleeding, contractions, leaking of fluid and fetal movement were reviewed in detail with the patient.   Return for weekly Makena inj and 2 wk growth scan and ROB.  [place], CNM 05/02/2020 3:39 PM

## 2020-05-09 ENCOUNTER — Other Ambulatory Visit: Payer: Self-pay

## 2020-05-09 ENCOUNTER — Ambulatory Visit (INDEPENDENT_AMBULATORY_CARE_PROVIDER_SITE_OTHER): Payer: Medicaid Other

## 2020-05-09 DIAGNOSIS — Z3A34 34 weeks gestation of pregnancy: Secondary | ICD-10-CM

## 2020-05-09 DIAGNOSIS — O09291 Supervision of pregnancy with other poor reproductive or obstetric history, first trimester: Secondary | ICD-10-CM

## 2020-05-09 DIAGNOSIS — O09293 Supervision of pregnancy with other poor reproductive or obstetric history, third trimester: Secondary | ICD-10-CM | POA: Diagnosis not present

## 2020-05-09 MED ORDER — HYDROXYPROGESTERONE CAPROATE 250 MG/ML IM OIL
250.0000 mg | TOPICAL_OIL | Freq: Once | INTRAMUSCULAR | Status: AC
Start: 2020-05-09 — End: 2020-05-09
  Administered 2020-05-09: 250 mg via INTRAMUSCULAR

## 2020-05-15 ENCOUNTER — Other Ambulatory Visit: Payer: Self-pay

## 2020-05-15 ENCOUNTER — Observation Stay
Admission: EM | Admit: 2020-05-15 | Discharge: 2020-05-15 | Disposition: A | Discharge status: Home / Self Care | Payer: Medicaid Other | Attending: Obstetrics & Gynecology | Admitting: Obstetrics & Gynecology

## 2020-05-15 ENCOUNTER — Encounter: Payer: Self-pay | Admitting: Obstetrics & Gynecology

## 2020-05-15 DIAGNOSIS — R1031 Right lower quadrant pain: Secondary | ICD-10-CM | POA: Insufficient documentation

## 2020-05-15 DIAGNOSIS — R1032 Left lower quadrant pain: Secondary | ICD-10-CM | POA: Insufficient documentation

## 2020-05-15 DIAGNOSIS — O26899 Other specified pregnancy related conditions, unspecified trimester: Secondary | ICD-10-CM

## 2020-05-15 DIAGNOSIS — Z3A35 35 weeks gestation of pregnancy: Secondary | ICD-10-CM | POA: Diagnosis not present

## 2020-05-15 DIAGNOSIS — R1030 Lower abdominal pain, unspecified: Secondary | ICD-10-CM | POA: Diagnosis not present

## 2020-05-15 DIAGNOSIS — O26893 Other specified pregnancy related conditions, third trimester: Principal | ICD-10-CM | POA: Insufficient documentation

## 2020-05-15 DIAGNOSIS — Z3490 Encounter for supervision of normal pregnancy, unspecified, unspecified trimester: Secondary | ICD-10-CM

## 2020-05-15 LAB — RUPTURE OF MEMBRANE (ROM)PLUS: Rom Plus: NEGATIVE

## 2020-05-15 NOTE — OB Triage Note (Signed)
Pt reports to unit c/o of a "big gush of fluid" around 1900 last night. Pt reports putting on a panty liner and monitoring at home for a few hours but after still seeing some clear fluid, she decided to com to be evaluated. Pt also reports some occasional lower abdominal cramping but "not hurting". Pt also reports "a shock of lightning every time I walk around" in vaginal area. Pt reports +FM and denies vaginal bleeding. External monitors applied, initial FHTs 140s. Vital signs WDL. Upon RN assessment, no LOF noted.

## 2020-05-15 NOTE — OB Triage Note (Signed)
Discharge instructions and labor precautions reviewed with patient. Patient verbalized understanding, all questions answered. Patient discharged home ambulatory.

## 2020-05-15 NOTE — Final Progress Note (Signed)
Physician Final Progress Note  Patient ID: Linda Choi MRN: [id number] DOB/AGE: 26-Jan-1996 24 y.o.  Admit date: 05/15/2020 Admitting provider: Nadara Mustard, MD Discharge date: 05/15/2020  Admission Diagnoses: Active Problems:   35 week Pregnancy   Pregnancy related bilateral lower abdominal pain, antepartum  Discharge Diagnoses:  Active Problems:   Pregnancy   Pregnancy related bilateral lower abdominal pain, antepartum  Consults: None  Significant Findings/ Diagnostic Studies: Patient presented for evaluation of labor.  Patient had cervical exam by RN and this was reported to me. I reviewed her vital signs and fetal tracing, both of which were reassuring.  Patient was discharge as she was not laboring.  Procedures: A NST procedure was performed with FHR monitoring and a normal baseline established, appropriate time of 20-40 minutes of evaluation, and accels >2 seen w 15x15 characteristics.  Results show a REACTIVE NST.   Discharge Condition: good  Disposition: Discharge disposition: 01-Home or Self Care       Diet: Regular diet  Discharge Activity: Activity as tolerated   Allergies as of 05/15/2020   No Known Allergies     Medication List    TAKE these medications   B-D 3CC LUER-LOK SYR 18GX1-1/2 18G X 1-1/2" 3 ML Misc Generic drug: SYRINGE-NEEDLE (DISP) 3 ML See admin instructions.   BD Disp Needles 21G X 1-1/2" Misc Generic drug: NEEDLE (DISP) 21 G See admin instructions.   hydroxyprogesterone caproate 250 mg/mL Oil injection Commonly known as: MAKENA Inject 1 mL (250 mg total) into the muscle once for 1 dose.   Prenate Pixie 10-0.6-0.4-200 MG Caps Take 1 capsule by mouth daily.        Total time spent taking care of this patient: TRIAGE minutes  Signed: Letitia Libra 05/15/2020, 6:42 AM

## 2020-05-15 NOTE — Discharge Instructions (Signed)
Return to hospital for any of the following:  -contractions at least 5 minutes apart -leakage of fluid -decreased fetal movement  -vaginal bleeding    

## 2020-05-15 NOTE — Discharge Summary (Signed)
  See FPN 

## 2020-05-16 ENCOUNTER — Ambulatory Visit (INDEPENDENT_AMBULATORY_CARE_PROVIDER_SITE_OTHER): Payer: Medicaid Other

## 2020-05-16 ENCOUNTER — Encounter: Payer: Self-pay | Admitting: Obstetrics & Gynecology

## 2020-05-16 ENCOUNTER — Ambulatory Visit (INDEPENDENT_AMBULATORY_CARE_PROVIDER_SITE_OTHER): Payer: Medicaid Other | Admitting: Obstetrics & Gynecology

## 2020-05-16 VITALS — BP 120/70 | Ht 64.0 in | Wt 214.8 lb

## 2020-05-16 DIAGNOSIS — O09293 Supervision of pregnancy with other poor reproductive or obstetric history, third trimester: Secondary | ICD-10-CM | POA: Diagnosis not present

## 2020-05-16 DIAGNOSIS — O0993 Supervision of high risk pregnancy, unspecified, third trimester: Secondary | ICD-10-CM

## 2020-05-16 DIAGNOSIS — O09299 Supervision of pregnancy with other poor reproductive or obstetric history, unspecified trimester: Secondary | ICD-10-CM

## 2020-05-16 DIAGNOSIS — O09893 Supervision of other high risk pregnancies, third trimester: Secondary | ICD-10-CM

## 2020-05-16 DIAGNOSIS — Z3A35 35 weeks gestation of pregnancy: Secondary | ICD-10-CM

## 2020-05-16 LAB — POCT URINALYSIS DIPSTICK OB: Glucose, UA: NEGATIVE

## 2020-05-16 MED ORDER — HYDROXYPROGESTERONE CAPROATE 250 MG/ML IM OIL
250.0000 mg | TOPICAL_OIL | Freq: Once | INTRAMUSCULAR | Status: AC
  Administered 2020-05-16: 250 mg via INTRAMUSCULAR

## 2020-05-16 NOTE — Addendum Note (Signed)
Addended by: Loran Senters D on: 05/16/2020 05:10 PM   Modules accepted: Orders

## 2020-05-16 NOTE — Patient Instructions (Addendum)
Thank you for choosing [facility]. As part of our ongoing efforts to improve patient experience, we would appreciate your feedback. Please fill out the short survey that you will receive by mail or MyChart. Your opinion is important to us! -Dr Kamdyn Covel  Braxton Hicks Contractions Contractions of the uterus can occur throughout pregnancy, but they are not always a sign that you are in labor. You may have practice contractions called Braxton Hicks contractions. These false labor contractions are sometimes confused with true labor. What are Braxton Hicks contractions? Braxton Hicks contractions are tightening movements that occur in the muscles of the uterus before labor. Unlike true labor contractions, these contractions do not result in opening (dilation) and thinning of the cervix. Toward the end of pregnancy (32-34 weeks), Braxton Hicks contractions can happen more often and may become stronger. These contractions are sometimes difficult to tell apart from true labor because they can be very uncomfortable. You should not feel embarrassed if you go to the hospital with false labor. Sometimes, the only way to tell if you are in true labor is for your health care provider to look for changes in the cervix. The health care provider will do a physical exam and may monitor your contractions. If you are not in true labor, the exam should show that your cervix is not dilating and your water has not broken. If there are no other health problems associated with your pregnancy, it is completely safe for you to be sent home with false labor. You may continue to have Braxton Hicks contractions until you go into true labor. How to tell the difference between true labor and false labor True labor  Contractions last 30-70 seconds.  Contractions become very regular.  Discomfort is usually felt in the top of the uterus, and it spreads to the lower abdomen and low back.  Contractions do not go away with  walking.  Contractions usually become more intense and increase in frequency.  The cervix dilates and gets thinner. False labor  Contractions are usually shorter and not as strong as true labor contractions.  Contractions are usually irregular.  Contractions are often felt in the front of the lower abdomen and in the groin.  Contractions may go away when you walk around or change positions while lying down.  Contractions get weaker and are shorter-lasting as time goes on.  The cervix usually does not dilate or become thin. Follow these instructions at home:   Take over-the-counter and prescription medicines only as told by your health care provider.  Keep up with your usual exercises and follow other instructions from your health care provider.  Eat and drink lightly if you think you are going into labor.  If Braxton Hicks contractions are making you uncomfortable: ? Change your position from lying down or resting to walking, or change from walking to resting. ? Sit and rest in a tub of warm water. ? Drink enough fluid to keep your urine pale yellow. Dehydration may cause these contractions. ? Do slow and deep breathing several times an hour.  Keep all follow-up prenatal visits as told by your health care provider. This is important. Contact a health care provider if:  You have a fever.  You have continuous pain in your abdomen. Get help right away if:  Your contractions become stronger, more regular, and closer together.  You have fluid leaking or gushing from your vagina.  You pass blood-tinged mucus (bloody show).  You have bleeding from your vagina.    You have low back pain that you never had before.  You feel your baby's head pushing down and causing pelvic pressure.  Your baby is not moving inside you as much as it used to. Summary  Contractions that occur before labor are called Braxton Hicks contractions, false labor, or practice contractions.  Braxton  Hicks contractions are usually shorter, weaker, farther apart, and less regular than true labor contractions. True labor contractions usually become progressively stronger and regular, and they become more frequent.  Manage discomfort from Braxton Hicks contractions by changing position, resting in a warm bath, drinking plenty of water, or practicing deep breathing. This information is not intended to replace advice given to you by your health care provider. Make sure you discuss any questions you have with your health care provider. Document Revised: 06/06/2017 Document Reviewed: 11/07/2016 Elsevier Patient Education  2020 Elsevier Inc[employer]  

## 2020-05-16 NOTE — Progress Notes (Signed)
  Subjective  Fetal Movement? yes Contractions? no Leaking Fluid? no Vaginal Bleeding? no [place] DICUSSED Objective  BP 120/70   Ht 5\' 4"  (1.626 m)   Wt 214 lb 12.8 oz (97.4 kg)   LMP 09/05/2019 (LMP Unknown)   BMI 36.87 kg/m  General: NAD Pumonary: no increased work of breathing Abdomen: gravid, non-tender Extremities: no edema Psychiatric: mood appropriate, affect full  Assessment  24 y.o. 09/07/2019 at [redacted]w[redacted]d by  06/16/2020, by Ultrasound presenting for routine prenatal visit  Plan   Problem List Items Addressed This Visit      Other   History of macrosomia in infant in prior pregnancy, currently pregnant   Pregnancy - Primary   Relevant Orders   POC Urinalysis Dipstick OB    Other Visit Diagnoses    Supervision of high risk pregnancy in third trimester       History of preterm delivery, currently pregnant in third trimester          pregnancy 4 Problems (from 09/05/19 to present)    Problem Noted Resolved   History of macrosomia in infant in prior pregnancy, currently pregnant 01/23/2017 by 01/25/2017, MD No   Overview Signed 01/23/2017 10:20 AM by 01/25/2017, MD    [ ]  Growth scan at 36 weeks      History of pregnancy loss in prior pregnancy, currently pregnant in first trimester 10/03/2016 by , CNM No   Overview Addendum 04/18/2020  1:46 PM by Farrel Conners, MD    17P injections starting at 16 weeks.      Previous Version   Supervision of high risk pregnancy, antepartum 10/03/2016 by Nadara Mustard, CNM No   Overview Addendum 04/07/2020  5:10 PM by Farrel Conners, CNM    Clinic [facility] Prenatal Labs  Dating LMP = 10 week 06/07/2020 Blood type: A/Negative/-- (05/18 1417)   Genetic Screen NIPS:nml XY Antibody:Negative (05/18 1417)  Anatomic 10-30-1981 Complete 01/18/2020 Rubella: 1.42 (05/18 1417) Varicella: Imm  GTT Third trimester:  RPR: Non Reactive (05/18 1417)   Rhogam 03/21/20 HBsAg: Negative (05/18 1417)   TDaP vaccine 04/07/20              Flu Shot: 10/1/121 HIV: Non Reactive (05/18 1417)   Baby Food Bottle                        GBS:   Contraception  Unsure, counseled as to Nexplanon and IUD Pap:11/08/19 NILM  CBB  no   CS/VBAC n/a   Support Person     Needs 17P injections starting at 16 weeks LAST INJECTION TODAY        Previous Version   Rh negative state in antepartum period 10/03/2016 by 01/08/20, CNM No   Overview Signed 04/18/2020  1:45 PM by Farrel Conners, MD    Rhogam 03/21/20          Nadara Mustard, MD, 03/23/20 Ob/Gyn, [facility] 05/16/2020  5:01 PM

## 2020-05-23 ENCOUNTER — Other Ambulatory Visit: Payer: Self-pay

## 2020-05-23 ENCOUNTER — Other Ambulatory Visit (HOSPITAL_COMMUNITY)
Admission: RE | Admit: 2020-05-23 | Discharge: 2020-05-23 | Disposition: A | Discharge status: Home / Self Care | Payer: Medicaid Other | Source: Ambulatory Visit | Attending: Obstetrics and Gynecology | Admitting: Obstetrics and Gynecology

## 2020-05-23 ENCOUNTER — Ambulatory Visit (INDEPENDENT_AMBULATORY_CARE_PROVIDER_SITE_OTHER): Payer: Medicaid Other | Admitting: Obstetrics and Gynecology

## 2020-05-23 VITALS — BP 128/70 | Wt 214.0 lb

## 2020-05-23 DIAGNOSIS — Z3A36 36 weeks gestation of pregnancy: Secondary | ICD-10-CM

## 2020-05-23 DIAGNOSIS — O09299 Supervision of pregnancy with other poor reproductive or obstetric history, unspecified trimester: Secondary | ICD-10-CM | POA: Diagnosis not present

## 2020-05-23 DIAGNOSIS — O0990 Supervision of high risk pregnancy, unspecified, unspecified trimester: Secondary | ICD-10-CM | POA: Insufficient documentation

## 2020-05-23 DIAGNOSIS — Z113 Encounter for screening for infections with a predominantly sexual mode of transmission: Secondary | ICD-10-CM | POA: Insufficient documentation

## 2020-05-23 DIAGNOSIS — Z3685 Encounter for antenatal screening for Streptococcus B: Secondary | ICD-10-CM

## 2020-05-23 LAB — POCT URINALYSIS DIPSTICK OB

## 2020-05-23 NOTE — Progress Notes (Signed)
Routine Prenatal Care Visit  Subjective  Linda Choi is a 24 y.o. [phone] at [redacted]w[redacted]d being seen today for ongoing prenatal care.  She is currently monitored for the following issues for this high-risk pregnancy and has History of pregnancy loss in prior pregnancy, currently pregnant in first trimester; Supervision of high risk pregnancy, antepartum; Rh negative state in antepartum period; History of macrosomia in infant in prior pregnancy, currently pregnant; Pregnancy; UTI (urinary tract infection); Nausea vomiting and diarrhea; and Pregnancy related bilateral lower abdominal pain, antepartum on their problem list.  ----------------------------------------------------------------------------------- Patient reports no complaints.   Contractions: Not present. Vag. Bleeding: None.  Movement: Present. Denies leaking of fluid.  ----------------------------------------------------------------------------------- The following portions of the patient's history were reviewed and updated as appropriate: allergies, current medications, past family history, past medical history, past social history, past surgical history and problem list. Problem list updated.   Objective  Blood pressure 128/70, weight 214 lb (97.1 kg), last menstrual period 09/05/2019. Pregravid weight 156 lb (70.8 kg) Total Weight Gain 58 lb (26.3 kg) Urinalysis:      Fetal Status: Fetal Heart Rate (bpm): 140 Fundal Height: 37 cm Movement: Present  Presentation: Vertex  General:  Alert, oriented and cooperative. Patient is in no acute distress.  Skin: Skin is warm and dry. No rash noted.   Cardiovascular: Normal heart rate noted  Respiratory: Normal respiratory effort, no problems with respiration noted  Abdomen: Soft, gravid, appropriate for gestational age. Pain/Pressure: Absent     Pelvic:  Cervical exam performed Dilation: 1 Effacement (%): 50 Station: -3  Extremities: Normal range of motion.     ental Status: Normal  mood and affect. Normal behavior. Normal judgment and thought content.     Assessment   24 y.o. [id number] at [redacted]w[redacted]d by  06/16/2020, by Ultrasound presenting for routine prenatal visit  Plan   pregnancy 4 Problems (from 09/05/19 to present)    Problem Noted Resolved   History of macrosomia in infant in prior pregnancy, currently pregnant 01/23/2017 by Vena [place], MD No   Overview Signed 01/23/2017 10:20 AM by Vena Austria, MD    [ ]  Growth scan at 36 weeks      History of pregnancy loss in prior pregnancy, currently pregnant in first trimester 10/03/2016 by 10/05/2016, CNM No   Overview Addendum 04/18/2020  1:46 PM by 06/18/2020, MD    17P injections starting at 16 weeks.      Previous Version   Supervision of high risk pregnancy, antepartum 10/03/2016 by 10/05/2016, CNM No   Overview Addendum 04/07/2020  5:10 PM by 06/07/2020, CNM    Clinic [facility] Prenatal Labs  Dating LMP = 10 week [place] Blood type: A/Negative/-- (05/18 1417)   Genetic Screen NIPS:nml XY Antibody:Negative (05/18 1417)  Anatomic 10-30-1981 Complete 01/18/2020 Rubella: 1.42 (05/18 1417) Varicella: Imm  GTT Third trimester:  RPR: Non Reactive (05/18 1417)   Rhogam 03/21/20 HBsAg: Negative (05/18 1417)   TDaP vaccine 04/07/20             Flu Shot: 10/1/121 HIV: Non Reactive (05/18 1417)   Baby Food Bottle                        GBS:   Contraception  Unsure, counseled as to Nexplanon and IUD Pap:11/08/19 NILM  CBB  no   CS/VBAC n/a   Support Person     Needs 17P injections starting at 16 weeks  Previous Version   Rh negative state in antepartum period 10/03/2016 by Farrel Conners, CNM No   Overview Signed 04/18/2020  1:45 PM by Nadara Mustard, MD    Rhogam 03/21/20          Gestational age appropriate obstetric precautions including but not limited to vaginal bleeding, contractions, leaking of fluid and fetal movement were reviewed in detail with the patient.    Return in  about 1 week (around 05/30/2020) for ROB.  Vena Austria, MD, Evern Core [facility], [facility] 05/23/2020, 2:37 PM

## 2020-05-23 NOTE — Progress Notes (Signed)
HROB - No concerns, RM 5

## 2020-05-24 ENCOUNTER — Other Ambulatory Visit: Payer: Self-pay | Admitting: Obstetrics and Gynecology

## 2020-05-24 DIAGNOSIS — Z3A36 36 weeks gestation of pregnancy: Secondary | ICD-10-CM

## 2020-05-24 NOTE — Addendum Note (Signed)
Addended by: Lorrene Reid on: 05/24/2020 04:40 PM   Modules accepted: Orders

## 2020-05-24 NOTE — Addendum Note (Signed)
Addended by: Lorrene Reid on: 05/24/2020 02:35 PM   Modules accepted: Orders

## 2020-05-24 NOTE — Progress Notes (Unsigned)
gbs

## 2020-05-25 LAB — CERVICOVAGINAL ANCILLARY ONLY
Chlamydia: NEGATIVE
Comment: NEGATIVE
Comment: NORMAL
Neisseria Gonorrhea: NEGATIVE

## 2020-05-25 NOTE — Telephone Encounter (Signed)
Should she wait until she having contractions?

## 2020-05-26 LAB — CULTURE, BETA STREP (GROUP B ONLY)

## 2020-05-30 ENCOUNTER — Other Ambulatory Visit: Payer: Self-pay

## 2020-05-30 ENCOUNTER — Encounter: Payer: Self-pay | Admitting: Obstetrics

## 2020-05-30 ENCOUNTER — Ambulatory Visit (INDEPENDENT_AMBULATORY_CARE_PROVIDER_SITE_OTHER): Payer: Medicaid Other | Admitting: Obstetrics

## 2020-05-30 VITALS — BP 118/80 | Wt 219.0 lb

## 2020-05-30 DIAGNOSIS — O0993 Supervision of high risk pregnancy, unspecified, third trimester: Secondary | ICD-10-CM

## 2020-05-30 DIAGNOSIS — Z3A37 37 weeks gestation of pregnancy: Secondary | ICD-10-CM

## 2020-05-30 LAB — POCT URINALYSIS DIPSTICK OB
Glucose, UA: NEGATIVE
POC,PROTEIN,UA: NEGATIVE

## 2020-05-30 NOTE — Progress Notes (Signed)
C/o has been having pressure and some leaking of something.rj

## 2020-05-30 NOTE — Progress Notes (Signed)
Routine Prenatal Care Visit  Subjective  Linda Choi is a 24 y.o. [phone] at [redacted]w[redacted]d being seen today for ongoing prenatal care.  She is currently monitored for the following issues for this high-risk pregnancy and has History of pregnancy loss in prior pregnancy, currently pregnant in first trimester; Supervision of high-risk pregnancy, third trimester; Rh negative state in antepartum period; History of macrosomia in infant in prior pregnancy, currently pregnant; Pregnancy; UTI (urinary tract infection); Nausea vomiting and diarrhea; and Pregnancy related bilateral lower abdominal pain, antepartum on their problem list.  ----------------------------------------------------------------------------------- Patient reports no complaints.   Contractions: Irregular. Vag. Bleeding: None.  Movement: Present. Patient unsure if she is leaking fluid vs vaginal discharge. Reports changing underwear three times today with watery discharge. ----------------------------------------------------------------------------------- The following portions of the patient's history were reviewed and updated as appropriate: allergies, current medications, past family history, past medical history, past social history, past surgical history and problem list. Problem list updated.   Objective  Last menstrual period 09/05/2019. Pregravid weight 156 lb (70.8 kg) Total Weight Gain 63 lb (28.6 kg) Urinalysis:      Fetal Status: Fetal Heart Rate (bpm): 140 Fundal Height: 38 cm Movement: Present  Presentation: Vertex  General:  Alert, oriented and cooperative. Patient is in no acute distress.  Skin: Skin is warm and dry. No rash noted.   Cardiovascular: Normal heart rate noted  Respiratory: Normal respiratory effort, no problems with respiration noted  Abdomen: Soft, gravid, appropriate for gestational age. Pain/Pressure: Absent     Pelvic:  Cervical exam performed Dilation: 1 Effacement (%): 50 Station: -3    External: Normal appearing vulva. No lesions noted.  Speculum examination: Normal appearing cervix. No blood in the vaginal vault. White, mucous discharge. No pooling, no fluid at cervical os with cough.   Extremities: Normal range of motion.  Edema: None  ental Status: Normal mood and affect. Normal behavior. Normal judgment and thought content.     Assessment   24 y.o. [id number] at [redacted]w[redacted]d by  06/16/2020, by Ultrasound presenting for routine prenatal visit  Plan   pregnancy 4 Problems (from 09/05/19 to present)    Problem Noted Resolved   History of macrosomia in infant in prior pregnancy, currently pregnant 01/23/2017 by Vena Austria, MD No   Overview Signed 01/23/2017 10:20 AM by Vena Austria, MD    [x ] Growth scan at 36 weeks      History of pregnancy loss in prior pregnancy, currently pregnant in first trimester 10/03/2016 by Farrel Conners, CNM No   Overview Addendum 04/18/2020  1:46 PM by Nadara Mustard, MD    17P injections starting at 16 weeks.      Previous Version   Supervision of high-risk pregnancy, third trimester 10/03/2016 by Farrel Conners, CNM No   Overview Addendum 05/30/2020  2:50 PM by Zipporah Plants, CNM    Clinic [facility] Prenatal Labs  Dating LMP = 10 week [place] Blood type: A/Negative/-- (05/18 1417)   Genetic Screen NIPS:nml XY Antibody:Negative (09/14 1520)  Anatomic [place] Complete 01/18/2020 Rubella: 1.42 (05/18 1417) Varicella: Imm  GTT Third trimester: Passed 3h 4/4 RPR: Non Reactive (09/14 1520)   Rhogam 03/21/20 HBsAg: Negative (05/18 1417)   TDaP vaccine 04/07/20             Flu Shot: 10/1/121 HIV: Non Reactive (09/14 1520)   Baby Food Bottle  GBS: Negative  Contraception  Unsure, counseled as to Nexplanon and IUD Pap:11/08/19 NILM  CBB  no   CS/VBAC n/a   Support Person    ...       Previous Version   Rh negative state in antepartum period 10/03/2016 by Farrel Conners, CNM No   Overview Signed 04/18/2020   1:45 PM by Nadara Mustard, MD    Rhogam 03/21/20          Gestational age appropriate obstetric precautions including but not limited to vaginal bleeding, contractions, leaking of fluid and fetal movement were reviewed in detail with the patient.   Reviewed dating from first trimester [place].   Return in about 1 week (around 06/06/2020) for ROB.  Zipporah Plants, CNM, MSN [facility], [facility] 05/30/2020, 3:20 PM

## 2020-06-06 ENCOUNTER — Ambulatory Visit (INDEPENDENT_AMBULATORY_CARE_PROVIDER_SITE_OTHER): Payer: Medicaid Other | Admitting: Obstetrics

## 2020-06-06 ENCOUNTER — Other Ambulatory Visit: Payer: Self-pay

## 2020-06-06 VITALS — BP 120/72 | Ht 64.0 in | Wt 217.4 lb

## 2020-06-06 DIAGNOSIS — Z3A38 38 weeks gestation of pregnancy: Secondary | ICD-10-CM

## 2020-06-06 DIAGNOSIS — O0993 Supervision of high risk pregnancy, unspecified, third trimester: Secondary | ICD-10-CM

## 2020-06-06 LAB — POCT URINALYSIS DIPSTICK OB: Glucose, UA: NEGATIVE

## 2020-06-06 NOTE — Progress Notes (Signed)
ROB [id number]

## 2020-06-06 NOTE — Progress Notes (Signed)
Routine Prenatal Care Visit  Subjective  Linda Choi is a 24 y.o. [phone] at [redacted]w[redacted]d being seen today for ongoing prenatal care.  She is currently monitored for the following issues for this high-risk pregnancy and has History of pregnancy loss in prior pregnancy, currently pregnant in first trimester; Supervision of high-risk pregnancy, third trimester; Rh negative state in antepartum period; History of macrosomia in infant in prior pregnancy, currently pregnant; Pregnancy; UTI (urinary tract infection); Nausea vomiting and diarrhea; and Pregnancy related bilateral lower abdominal pain, antepartum on their problem list.  ----------------------------------------------------------------------------------- Patient reports no complaints.  She is eager for labor however, and requests a VE. Her baby is moving well, and she denies any vaginal bleeding or regular contractions. Contractions: Irregular. Vag. Bleeding: None.  Movement: Present. Leaking Fluid denies.  ----------------------------------------------------------------------------------- The following portions of the patient's history were reviewed and updated as appropriate: allergies, current medications, past family history, past medical history, past social history, past surgical history and problem list. Problem list updated.  Objective  Blood pressure 120/72, height 5\' 4"  (1.626 m), weight 217 lb 6.4 oz (98.6 kg), last menstrual period 09/05/2019. Pregravid weight 156 lb (70.8 kg) Total Weight Gain 61 lb 6.4 oz (27.9 kg) Urinalysis: Urine Protein    Urine Glucose    Fetal Status:     Movement: Present     General:  Alert, oriented and cooperative. Patient is in no acute distress.  Skin: Skin is warm and dry. No rash noted.   Cardiovascular: Normal heart rate noted  Respiratory: Normal respiratory effort, no problems with respiration noted  Abdomen: Soft, gravid, appropriate for gestational age. Pain/Pressure: Absent     Pelvic:   Cervical exam performed      3.5cms/50%/-3. Cervix is quite posterior, stretchy and soft  Extremities: Normal range of motion.     Mental Status: Normal mood and affect. Normal behavior. Normal judgment and thought content.   Assessment   24 y.o. 09/07/2019 at [redacted]w[redacted]d by  06/16/2020, by Ultrasound presenting for routine prenatal visit  Plan   pregnancy 4 Problems (from 09/05/19 to present)    Problem Noted Resolved   History of macrosomia in infant in prior pregnancy, currently pregnant 01/23/2017 by 01/25/2017, MD No   Overview Signed 01/23/2017 10:20 AM by 01/25/2017, MD    [ ]  Growth scan at 36 weeks      History of pregnancy loss in prior pregnancy, currently pregnant in first trimester 10/03/2016 by , CNM No   Overview Addendum 04/18/2020  1:46 PM by Farrel Conners, MD    17P injections starting at 16 weeks.      Previous Version   Supervision of high-risk pregnancy, third trimester 10/03/2016 by Nadara Mustard, CNM No   Overview Addendum 05/30/2020  2:50 PM by Farrel Conners, CNM    [facility] Prenatal Labs  Dating LMP = 10 week 06/01/2020 Blood type: A/Negative/-- (05/18 1417)   Genetic Screen NIPS:nml XY Antibody:Negative (09/14 1520)  Anatomic [birth year]1981 Complete 01/18/2020 Rubella: 1.42 (05/18 1417) Varicella: Imm  GTT Third trimester: Passed 3h 4/4 RPR: Non Reactive (09/14 1520)   Rhogam 03/21/20 HBsAg: Negative (05/18 1417)   TDaP vaccine 04/07/20             Flu Shot: 10/1/121 HIV: Non Reactive (09/14 1520)   Baby Food Bottle                        GBS: Negative  Contraception  Unsure, counseled  as to Nexplanon and IUD Pap:11/08/19 NILM  CBB  no   CS/VBAC n/a   Support Person     Needs 17P injections starting at 16 weeks         Previous Version   Rh negative state in antepartum period 10/03/2016 by Farrel Conners, CNM No   Overview Signed 04/18/2020  1:45 PM by Nadara Mustard, MD    Rhogam 03/21/20          Term labor symptoms and  general obstetric precautions including but not limited to vaginal bleeding, contractions, leaking of fluid and fetal movement were reviewed in detail with the patient. Please refer to After Visit Summary for other counseling recommendations.  Labor precautions provided. Natural labor "inducers" described.   Return in about 1 week (around 06/13/2020) for return OB.  Mirna Mires, CNM  06/06/2020 3:04 PM

## 2020-06-07 ENCOUNTER — Other Ambulatory Visit: Payer: Self-pay

## 2020-06-07 ENCOUNTER — Encounter: Payer: Self-pay | Admitting: Obstetrics and Gynecology

## 2020-06-07 ENCOUNTER — Observation Stay
Admission: EM | Admit: 2020-06-07 | Discharge: 2020-06-08 | Disposition: A | Discharge status: Home / Self Care | Payer: Medicaid Other | Attending: Obstetrics and Gynecology | Admitting: Obstetrics and Gynecology

## 2020-06-07 DIAGNOSIS — O09299 Supervision of pregnancy with other poor reproductive or obstetric history, unspecified trimester: Secondary | ICD-10-CM

## 2020-06-07 DIAGNOSIS — Z3490 Encounter for supervision of normal pregnancy, unspecified, unspecified trimester: Secondary | ICD-10-CM

## 2020-06-07 DIAGNOSIS — O4693 Antepartum hemorrhage, unspecified, third trimester: Principal | ICD-10-CM

## 2020-06-07 DIAGNOSIS — O0993 Supervision of high risk pregnancy, unspecified, third trimester: Secondary | ICD-10-CM

## 2020-06-07 DIAGNOSIS — O26899 Other specified pregnancy related conditions, unspecified trimester: Secondary | ICD-10-CM

## 2020-06-07 DIAGNOSIS — Z6791 Unspecified blood type, Rh negative: Secondary | ICD-10-CM

## 2020-06-07 DIAGNOSIS — Z3A39 39 weeks gestation of pregnancy: Secondary | ICD-10-CM

## 2020-06-07 DIAGNOSIS — O09291 Supervision of pregnancy with other poor reproductive or obstetric history, first trimester: Secondary | ICD-10-CM

## 2020-06-07 NOTE — OB Triage Note (Signed)
Patient is [redacted]w[redacted]d, G4P2, she comes in with complaints of bright red bleeding, starting about 2130 today. Patient noticed a small amount when she goes to the bathroom. Patient states that her membranes were stripped in office yesterday in which she had light pink bleeding that subsided. Patient states that she is having some contractions, no pattern, rates them a 4 on pain scale of 0-10. FM+. VSS. Monitors applied and assessing.

## 2020-06-08 DIAGNOSIS — O4693 Antepartum hemorrhage, unspecified, third trimester: Secondary | ICD-10-CM | POA: Diagnosis not present

## 2020-06-08 DIAGNOSIS — Z3A38 38 weeks gestation of pregnancy: Secondary | ICD-10-CM | POA: Diagnosis not present

## 2020-06-08 DIAGNOSIS — Z3A39 39 weeks gestation of pregnancy: Secondary | ICD-10-CM | POA: Diagnosis not present

## 2020-06-08 NOTE — Discharge Instructions (Signed)
Return to hospital for any of the following:  -contractions at least 5 minutes apart -leakage of fluid  -vaginal bleeding  -decreased fetal movement

## 2020-06-08 NOTE — Discharge Summary (Signed)
See final progress note. 

## 2020-06-08 NOTE — Final Progress Note (Signed)
Final Progress Note  Patient ID: Linda Choi MRN: [id number] DOB/AGE: December 06, 1995 24 y.o.  Admit date: 06/07/2020 Admitting provider: Vena Austria, MD Discharge date: 06/08/2020   Admission Diagnoses: Vaginal bleeding in pregnancy, third trimester  Discharge Diagnoses:  Active Problems:   Pregnancy   Vaginal bleeding in pregnancy, third trimester   [redacted] weeks gestation of pregnancy    History of Present Illness: The patient is a 24 y.o. female [phone] at [redacted]w[redacted]d who presents with concern regarding vaginal bleeding. Patient first noted light pink, bloody spotting yesterday after SVE in the office. Today, discharge transitioned to an "dark blood" appearance. Patient also noted an increase in mucous discharge today. Patient has also been experiencing "cramps" throughout the evening. Denies LOF. Reports +FM.  Past Medical History:  Diagnosis Date  . Anemia   . Cervical incompetence   . Ovarian cyst   . Type A blood, Rh negative     Past Surgical History:  Procedure Laterality Date  . NO PAST SURGERIES      No current facility-administered medications on file prior to encounter.   Current Outpatient Medications on File Prior to Encounter  Medication Sig Dispense Refill  . Prenat-FeAsp-Meth-FA-DHA w/o A (PRENATE PIXIE) 10-0.6-0.4-200 MG CAPS Take 1 capsule by mouth daily. 90 capsule 3  . hydroxyprogesterone caproate (MAKENA) 250 mg/mL OIL injection Inject 1 mL (250 mg total) into the muscle once for 1 dose. 1 mL 20    No Known Allergies  Social History   Socioeconomic History  . Marital status: Single    Spouse name: Josefina Do  . Number of children: 2  . Years of education: GED  . Highest education level: Not on file  Occupational History  . Occupation: Stay at home mom   Tobacco Use  . Smoking status: Never Smoker  . Smokeless tobacco: Never Used  Vaping Use  . Vaping Use: Never used  Substance and Sexual Activity  . Alcohol use: No  . Drug use: No  .  Sexual activity: Yes    Partners: Male    Birth control/protection: None  Other Topics Concern  . Not on file  Social History Narrative   Lives with boyfriend and dad in a 2 story home.  Education: GED.    Social Determinants of Health   Financial Resource Strain:   . Difficulty of Paying Living Expenses: Not on file  Food Insecurity:   . Worried About Programme researcher, broadcasting/film/video in the Last Year: Not on file  . Ran Out of Food in the Last Year: Not on file  Transportation Needs:   . Lack of Transportation (Medical): Not on file  . Lack of Transportation (Non-Medical): Not on file  Physical Activity:   . Days of Exercise per Week: Not on file  . Minutes of Exercise per Session: Not on file  Stress:   . Feeling of Stress : Not on file  Social Connections:   . Frequency of Communication with Friends and Family: Not on file  . Frequency of Social Gatherings with Friends and Family: Not on file  . Attends Religious Services: Not on file  . Active Member of Clubs or Organizations: Not on file  . Attends Banker Meetings: Not on file  . Marital Status: Not on file  Intimate Partner Violence:   . Fear of Current or Ex-Partner: Not on file  . Emotionally Abused: Not on file  . Physically Abused: Not on file  . Sexually Abused: Not on file  Family History  Problem Relation Age of Onset  . Eclampsia Mother   . Breast cancer Neg Hx   . Ovarian cancer Neg Hx      Review of Systems  All other systems reviewed and are negative.    Physical Exam: BP 131/67 (BP Location: Left Arm)   Pulse (!) 126   Temp 98.3 F (36.8 C) (Oral)   Resp 16   Ht 5\' 4"  (1.626 m)   Wt 98.4 kg   LMP 09/05/2019 (LMP Unknown)   BMI 37.25 kg/m   Physical Exam Constitutional:      Appearance: Normal appearance.  Eyes:     Pupils: Pupils are equal, round, and reactive to light.  Cardiovascular:     Rate and Rhythm: Normal rate and regular rhythm.     Pulses: Normal pulses.  Pulmonary:      Effort: Pulmonary effort is normal.  Abdominal:     Comments: Gravid (size consistent with dates)   Musculoskeletal:        General: Normal range of motion.  Neurological:     Mental Status: She is alert and oriented to person, place, and time.  Skin:    General: Skin is warm and dry.  Psychiatric:        Mood and Affect: Mood normal.        Behavior: Behavior normal.     Consults: None  Significant Findings/ Diagnostic Studies: None  Procedures:  NONSTRESS TEST INTERPRETATION  INDICATIONS: vaginal bleeding and rule out uterine contractions FHR baseline: 135 bmp RESULTS:  A NST procedure was performed with FHR monitoring and a normal baseline established, appropriate time of 20-40 minutes of evaluation, and accels >2 seen w 15x15 characteristics.  Results show a REACTIVE NST.    Hospital Course: The patient was admitted to Labor and Delivery Triage for observation. SVE on admission 4/50/-3. Uterine irritability with intermittent contractions noted on monitor. Small amount of dark blood discharge noted, no further vaginal bleeding at this time. Two hour interval labor check, SVE remained 4/50/-3. Labor precautions reviewed, reassurance provided. All questions answered.  Discharge Condition: good  Disposition: Discharge disposition: 01-Home or Self Care       Diet: Regular diet  Discharge Activity: Activity as tolerated   Allergies as of 06/08/2020   No Known Allergies     Medication List    TAKE these medications   hydroxyprogesterone caproate 250 mg/mL Oil injection Commonly known as: MAKENA Inject 1 mL (250 mg total) into the muscle once for 1 dose.   Prenate Pixie 10-0.6-0.4-200 MG Caps Take 1 capsule by mouth daily.        Total time spent taking care of this patient: 20 minutes  Signed:  10-16-1984, CNM 06/08/2020, 1:09 AM

## 2020-06-12 ENCOUNTER — Encounter: Payer: Medicaid Other | Admitting: Obstetrics

## 2020-07-21 ENCOUNTER — Ambulatory Visit: Payer: Medicaid Other | Admitting: Obstetrics and Gynecology

## 2020-08-08 ENCOUNTER — Ambulatory Visit (INDEPENDENT_AMBULATORY_CARE_PROVIDER_SITE_OTHER): Payer: Medicaid Other | Admitting: Obstetrics and Gynecology

## 2020-08-08 ENCOUNTER — Encounter: Payer: Self-pay | Admitting: Obstetrics and Gynecology

## 2020-08-08 ENCOUNTER — Other Ambulatory Visit: Payer: Self-pay

## 2020-08-08 DIAGNOSIS — Z30430 Encounter for insertion of intrauterine contraceptive device: Secondary | ICD-10-CM

## 2020-08-08 NOTE — Progress Notes (Signed)
Postpartum Visit  Chief Complaint:  Chief Complaint  Patient presents with  . Post-op Follow-up    6 wk postpartum - no concerns. RM 4    History of Present Illness: Patient is a 25 y.o. [id number] presents for postpartum visit.  Date of delivery: 06/09/2019 Type of delivery: Vaginal delivery - Vacuum or forceps assisted  no Episiotomy No.  Laceration: no  Pregnancy or labor problems:  no Any problems since the delivery:  no  Newborn Details:  SINGLETON :  1. BabyGender female.  Maternal Details:  Breast or formula feeding: plans to bottle feed Intercourse: No  Contraception after delivery: No  Any bowel or bladder issues: No  Post partum depression/anxiety noted:  no Edinburgh Post-Partum Depression Score: 5 Date of last PAP: 11/08/2019  no abnormalities   Review of Systems: Review of Systems  Constitutional: Negative.   Gastrointestinal: Negative.   Genitourinary: Negative.   Psychiatric/Behavioral: Negative.     The following portions of the patient's history were reviewed and updated as appropriate: allergies, current medications, past family history, past medical history, past social history, past surgical history and problem list.  Past Medical History:  Past Medical History:  Diagnosis Date  . Anemia   . Cervical incompetence   . Ovarian cyst   . Type A blood, Rh negative     Past Surgical History:  Past Surgical History:  Procedure Laterality Date  . NO PAST SURGERIES      Family History:  Family History  Problem Relation Age of Onset  . Eclampsia Mother   . Breast cancer Neg Hx   . Ovarian cancer Neg Hx     Social History:  Social History   Socioeconomic History  . Marital status: Single    Spouse name: Josefina Do  . Number of children: 2  . Years of education: GED  . Highest education level: Not on file  Occupational History  . Occupation: Stay at home mom   Tobacco Use  . Smoking status: Never Smoker  . Smokeless tobacco: Never  Used  Vaping Use  . Vaping Use: Never used  Substance and Sexual Activity  . Alcohol use: No  . Drug use: No  . Sexual activity: Yes    Partners: Male    Birth control/protection: None  Other Topics Concern  . Not on file  Social History Narrative   Lives with boyfriend and dad in a 2 story home.  Education: GED.    Social Determinants of Health   Financial Resource Strain: Not on file  Food Insecurity: Not on file  Transportation Needs: Not on file  Physical Activity: Not on file  Stress: Not on file  Social Connections: Not on file  Intimate Partner Violence: Not on file    Allergies:  No Known Allergies  Medications: Prior to Admission medications   Medication Sig Start Date End Date Taking? Authorizing Provider  hydroxyprogesterone caproate (MAKENA) 250 mg/mL OIL injection Inject 1 mL (250 mg total) into the muscle once for 1 dose. 11/23/19 11/23/19  Conard Novak, MD  Prenat-FeAsp-Meth-FA-DHA w/o A (PRENATE PIXIE) 10-0.6-0.4-200 MG CAPS Take 1 capsule by mouth daily. 11/23/19   Conard Novak, MD    Physical Exam Blood pressure 118/72, height 5\' 4"  (1.626 m), weight 194 lb (88 kg), last menstrual period 09/05/2019.    General: NAD HEENT: normocephalic, anicteric Pulmonary: No increased work of breathing Abdomen: NABS, soft, non-tender, non-distended.  Umbilicus without lesions.  No hepatomegaly, splenomegaly or masses  palpable. No evidence of hernia. Genitourinary:  External: Normal external female genitalia.  Normal urethral meatus, normal  Bartholin's and Skene's glands.    Vagina: Normal vaginal mucosa, no evidence of prolapse.    Cervix: Grossly normal in appearance, no bleeding  Uterus: Non-enlarged, mobile, normal contour.  No CMT  Adnexa: ovaries non-enlarged, no adnexal masses  Rectal: deferred Extremities: no edema, erythema, or tenderness Neurologic: Grossly intact Psychiatric: mood appropriate, affect full   GYNECOLOGY OFFICE PROCEDURE  NOTE  Gini Caputo is a 25 y.o. [id number] here for a Mirena IUD insertion. No GYN concerns.  Last pap smear is up to date and was normal.  The patient is currently using postpartum period (abstinance) for contraception and her LMP is Patient's last menstrual period was 09/05/2019 (lmp unknown)..  The indication for her IUD is contraception/cycle control.  IUD Insertion Procedure Note Patient identified, informed consent performed, consent signed.   Discussed risks of irregular bleeding, cramping, infection, malpositioning, expulsion or uterine perforation of the IUD (1:1000 placements)  which may require further procedure such as laparoscopy.  IUD while effective at preventing pregnancy do not prevent transmission of sexually transmitted diseases and use of barrier methods for this purpose was discussed. Time out was performed.  Urine pregnancy test negative.  Speculum placed in the vagina.  Cervix visualized.  Cleaned with Betadine x 2.  Grasped anteriorly with a single tooth tenaculum.  Uterus sounded to 9 cm. IUD placed per manufacturer's recommendations.  Strings trimmed to 3 cm. Tenaculum was removed, good hemostasis noted.  Patient tolerated procedure well.   Patient was given post-procedure instructions.  She was advised to have backup contraception for one week.  Patient was also asked to check IUD strings periodically and follow up in 6 weeks for IUD check.     Edinburgh Postnatal Depression Scale - 08/08/20 1338      Edinburgh Postnatal Depression Scale:  In the Past 7 Days   I have been able to laugh and see the funny side of things. 0    I have looked forward with enjoyment to things. 0    I have blamed myself unnecessarily when things went wrong. 1    I have been anxious or worried for no good reason. 0    I have felt scared or panicky for no good reason. 2    Things have been getting on top of me. 2    I have been so unhappy that I have had difficulty sleeping. 0    I have  felt sad or miserable. 0    I have been so unhappy that I have been crying. 0    The thought of harming myself has occurred to me. 0    Edinburgh Postnatal Depression Scale Total 5           Assessment: 25 y.o. [id number] presenting for 6 week postpartum visit  Plan: Problem List Items Addressed This Visit   None   Visit Diagnoses    6 weeks postpartum follow-up    -  Primary   Encounter for IUD insertion           1) Contraception - Education given regarding options for contraception, as well as compatibility with breast feeding if applicable.  Patient plans on IUD for contraception.  2)  Pap - ASCCP guidelines and rational discussed.  ASCCP guidelines and rational discussed.  Patient opts for every 3 years screening interval  3) Patient underwent screening for postpartum depression with no signs  of depression  4) Return in about 6 weeks (around 09/19/2020) for IUD string check.   Vena Austria, MD, [facility], [facility] 08/08/2020, 2:01 PM

## 2020-08-23 ENCOUNTER — Telehealth: Payer: Self-pay

## 2020-08-23 NOTE — Telephone Encounter (Signed)
Patient thinks her mirena may have fallen out. I have her coming in on Monday, 08/28/20 with AMS

## 2020-08-27 IMAGING — US US OB < 14 WEEKS - US OB TV
1 series · 13 of 28 positions shown · non-contrast
Comparison: 10/13/2018

CLINICAL DATA: Pelvic pain, positive beta HCG

EXAM:
OBSTETRIC <14 WK US AND TRANSVAGINAL OB US
TECHNIQUE: Both transabdominal and transvaginal ultrasound examinations were
performed for complete evaluation of the gestation as well as the
maternal uterus, adnexal regions, and pelvic cul-de-sac.
Transvaginal technique was performed to assess early pregnancy.

[Series 1: us ob less than 14 weeks with ob transvaginal · 13 of 120 slices shown]
[im 5/120]
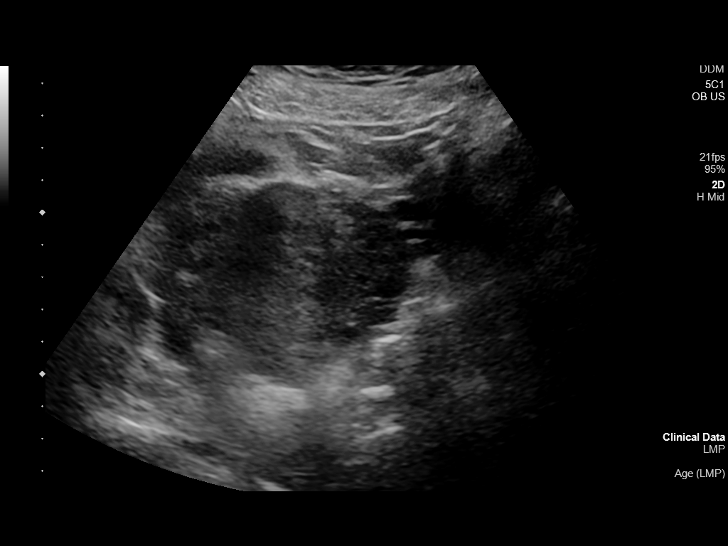
[im 14/120]
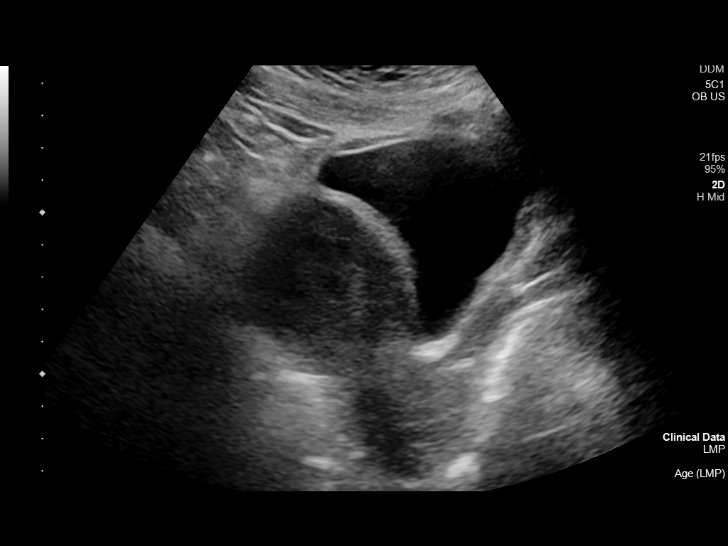
[im 23/120]
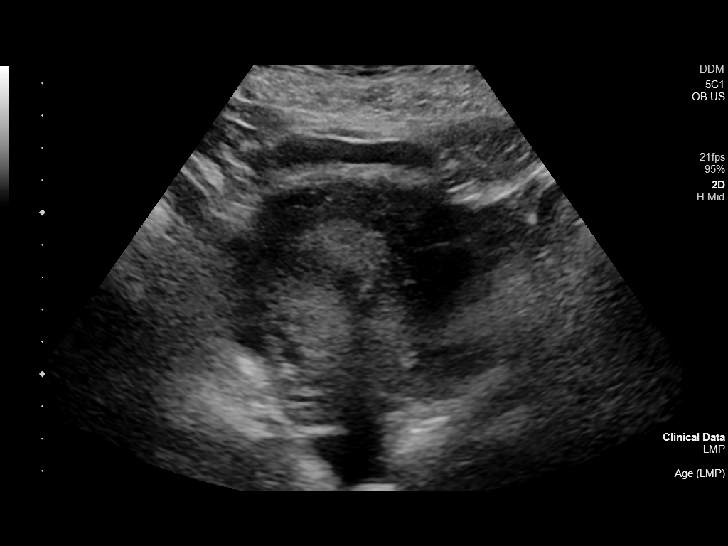
[im 31/120]
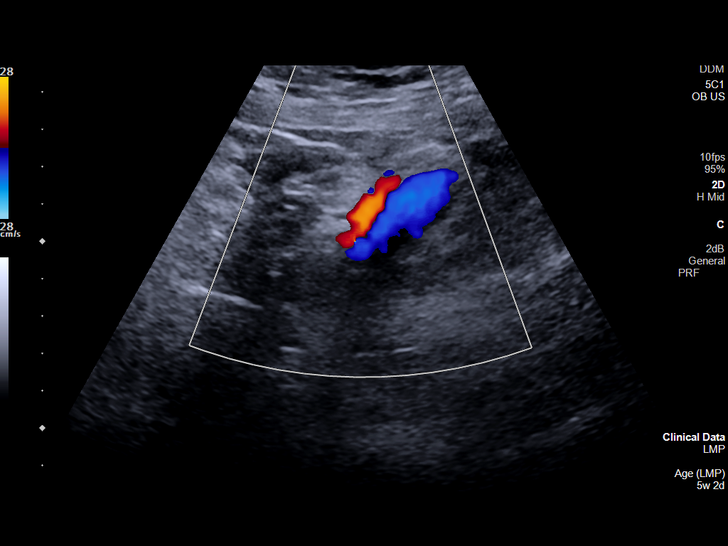
[im 40/120]
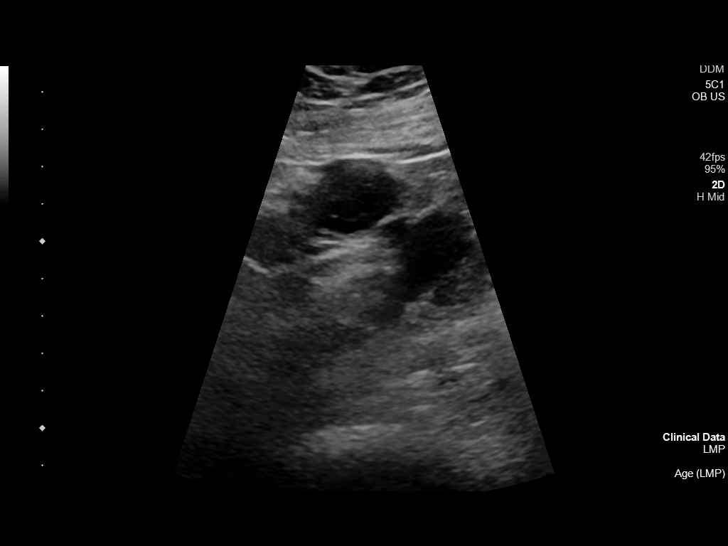
[im 49/120]
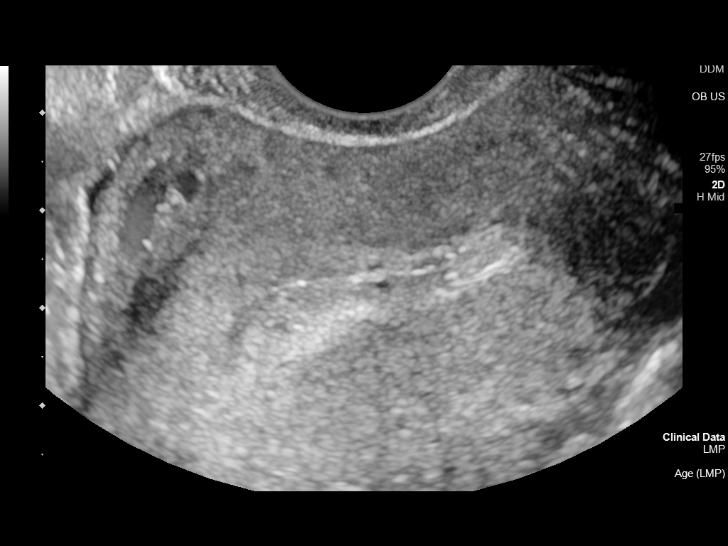
[im 62/120]
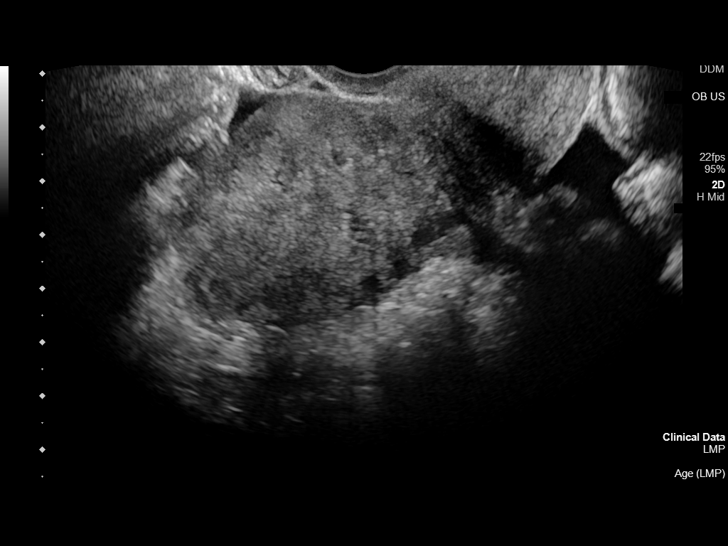
[im 71/120]
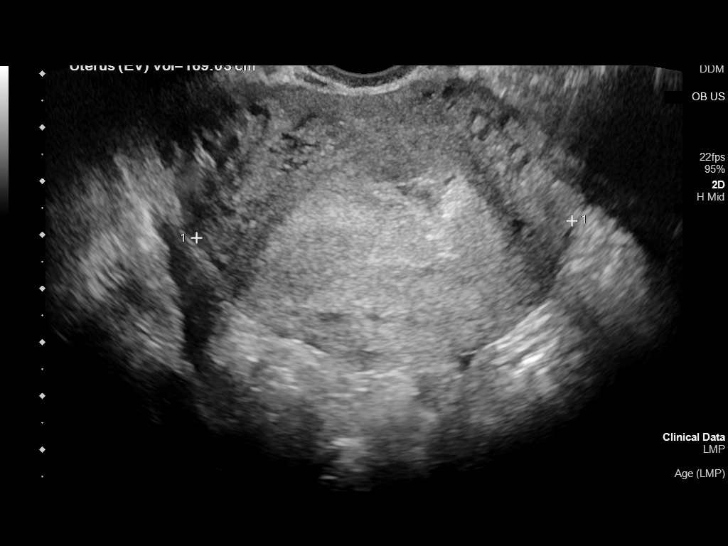
[im 80/120]
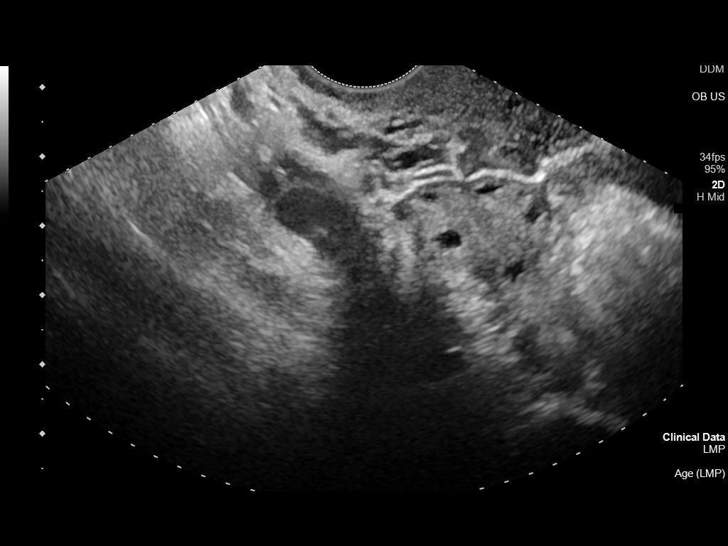
[im 89/120]
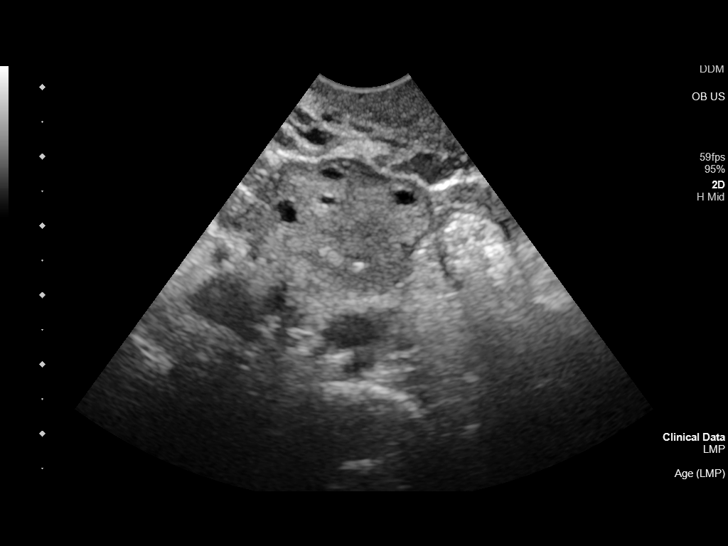
[im 97/120]
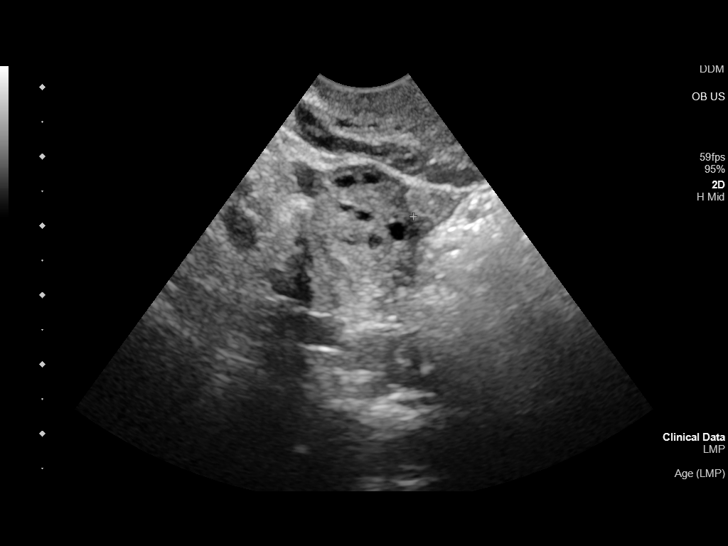
[im 106/120]
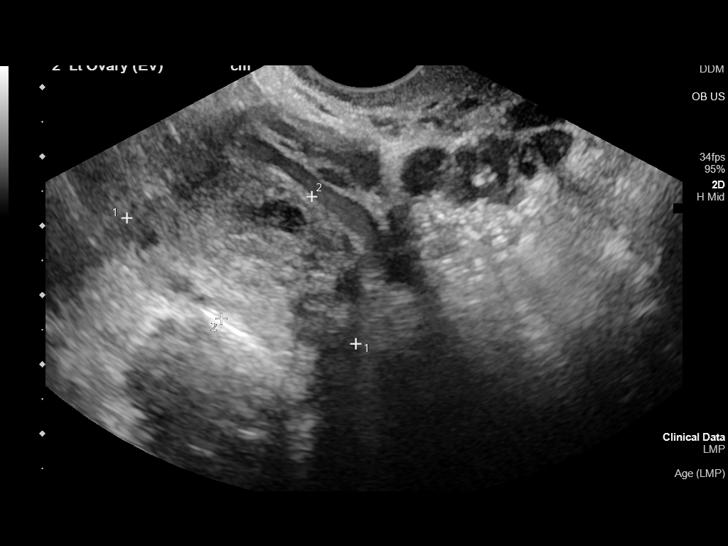
[im 115/120]
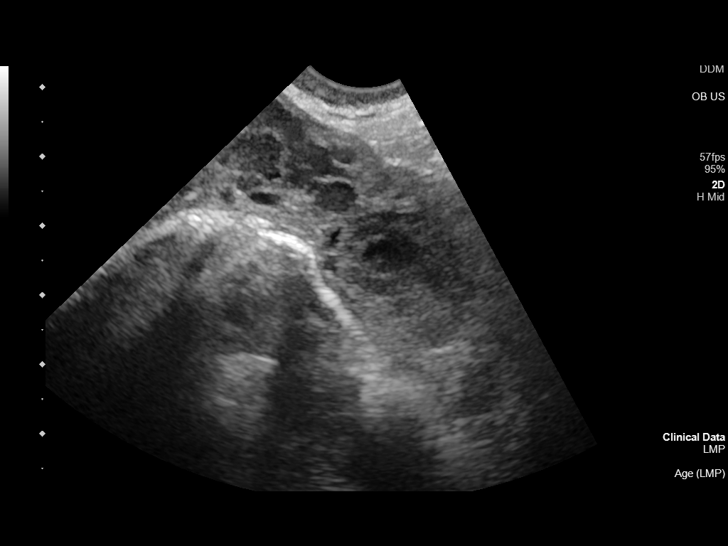

[13 of 28 positions shown; findings below may reference images not displayed]

FINDINGS: Intrauterine gestational sac: Possible intrauterine gestational sac.
There is a small round hypoechoic structure within the endometrial
cavity measuring 3.5 x 1.5 x 2.5 mm.

Yolk sac:  Not Visualized.

Embryo:  Not Visualized.

Cardiac Activity: Not Visualized.

MSD: 2.5 mm   5 w   0 d

Subchorionic hemorrhage:  None visualized.

Maternal uterus/adnexae: Left ovary measures 3.4 x 1.9 x 2.1 cm and
appears unremarkable. Right ovary measures 2.8 x 1.7 x 1.5 cm and
appears unremarkable. Small volume of free fluid within the
cul-de-sac.
IMPRESSION: 1. Probable early intrauterine gestational sac, but no yolk sac,
fetal pole, or cardiac activity yet visualized. Recommend follow-up
quantitative B-HCG levels and follow-up US in 14 days to assess
viability. This recommendation follows SRU consensus guidelines:
Diagnostic Criteria for Nonviable Pregnancy Early in the First
Trimester. N Engl J Med 4659; [DATE].
2. Small volume free fluid within the cul-de-sac.

## 2020-08-28 ENCOUNTER — Ambulatory Visit (INDEPENDENT_AMBULATORY_CARE_PROVIDER_SITE_OTHER): Payer: Medicaid Other | Admitting: Obstetrics and Gynecology

## 2020-08-28 ENCOUNTER — Other Ambulatory Visit: Payer: Self-pay

## 2020-08-28 ENCOUNTER — Encounter: Payer: Self-pay | Admitting: Obstetrics and Gynecology

## 2020-08-28 VITALS — BP 118/78 | Ht 64.0 in | Wt 191.0 lb

## 2020-08-28 DIAGNOSIS — Z30431 Encounter for routine checking of intrauterine contraceptive device: Secondary | ICD-10-CM

## 2020-08-28 NOTE — Progress Notes (Signed)
Obstetrics & Gynecology Office Visit   Chief Complaint:  Chief Complaint  Patient presents with  . Follow-up    IUD - can't find strings, Low ABD cramping. RM 4    History of Present Illness: 25 y.o. patient presenting for follow up of Mirena IUD placement 3 weeks ago.  The indication for her IUD was contraception.  She denies any complications since her IUD placement.  Still having some occasional spotting.  She is not able to feel strings.  Was seen [facility] 08/25/2020 and at that time IUD was noted to be in good position. Presented for heavier bleeding and cramping.  UPT negative.    Review of Systems: Review of Systems  Constitutional: Negative.   Gastrointestinal: Positive for abdominal pain. Negative for blood in stool, constipation, diarrhea, heartburn, melena, nausea and vomiting.  Genitourinary: Negative.   Neurological: Negative for headaches.    Past Medical History:  Past Medical History:  Diagnosis Date  . Anemia   . Cervical incompetence   . Ovarian cyst   . Type A blood, Rh negative     Past Surgical History:  Past Surgical History:  Procedure Laterality Date  . NO PAST SURGERIES      Gynecologic History: No LMP recorded.  Obstetric History: [id number]  Family History:  Family History  Problem Relation Age of Onset  . Eclampsia Mother   . Breast cancer Neg Hx   . Ovarian cancer Neg Hx     Social History:  Social History   Socioeconomic History  . Marital status: Single    Spouse name: Josefina Do  . Number of children: 2  . Years of education: GED  . Highest education level: Not on file  Occupational History  . Occupation: Stay at home mom   Tobacco Use  . Smoking status: Never Smoker  . Smokeless tobacco: Never Used  Vaping Use  . Vaping Use: Never used  Substance and Sexual Activity  . Alcohol use: No  . Drug use: No  . Sexual activity: Yes    Partners: Male    Birth control/protection: I.U.D.  Other Topics Concern   . Not on file  Social History Narrative   Lives with boyfriend and dad in a 2 story home.  Education: GED.    Social Determinants of Health   Financial Resource Strain: Not on file  Food Insecurity: Not on file  Transportation Needs: Not on file  Physical Activity: Not on file  Stress: Not on file  Social Connections: Not on file  Intimate Partner Violence: Not on file    Allergies:  No Known Allergies  Medications: Prior to Admission medications   Medication Sig Start Date End Date Taking? Authorizing Provider  Prenat-FeAsp-Meth-FA-DHA w/o A (PRENATE PIXIE) 10-0.6-0.4-200 MG CAPS Take 1 capsule by mouth daily. 11/23/19  Yes Conard Novak, MD  hydroxyprogesterone caproate (MAKENA) 250 mg/mL OIL injection Inject 1 mL (250 mg total) into the muscle once for 1 dose. 11/23/19 11/23/19  Conard Novak, MD    Physical Exam Blood pressure 118/78, height 5\' 4"  (1.626 m), weight 191 lb (86.6 kg), unknown if currently breastfeeding. No LMP recorded.  General: NAD HEENT: normocephalic, anicteric Pulmonary: No increased work of breathing Genitourinary:  External: Normal external female genitalia.  Normal urethral meatus, normal  Bartholin's and Skene's glands.    Vagina: Normal vaginal mucosa, no evidence of prolapse.    Cervix: Grossly normal in appearance, minimal bleeding, IUD strings visualized 2cm  Uterus:  Non-enlarged, mobile, normal contour.  No CMT  Adnexa: ovaries non-enlarged, no adnexal masses  Rectal: deferred  Lymphatic: no evidence of inguinal lymphadenopathy Extremities: no edema, erythema, or tenderness Neurologic: Grossly intact Psychiatric: mood appropriate, affect full  Female chaperone present for pelvic and breast  portions of the physical exam  Assessment: 25 y.o. [id number] No problem-specific Assessment & Plan notes found for this encounter.   Plan: Problem List Items Addressed This Visit   None      1.  Bleeding cramping - Imaging report from  outside facility reviewed, IUD in good position and string appropriate length today with minimal bleeding.  Based on ultrasound endometrium is starting to thin appropriately 57mm  2) A total of 15 minutes were spent in face-to-face contact with the patient during this encounter with over half of that time devoted to counseling and coordination of care.  3) Return in 22 days (on 09/19/2020) for keep previously scheduled 09/20/19 string check.   Vena Austria, MD, [facility], [facility] 08/28/2020, 11:56 AM

## 2020-09-19 ENCOUNTER — Ambulatory Visit: Payer: Medicaid Other | Admitting: Obstetrics and Gynecology

## 2021-04-27 NOTE — Telephone Encounter (Signed)
Mirena was noted to be in good position. New device not rcvd.

## 2021-08-28 ENCOUNTER — Other Ambulatory Visit: Payer: Self-pay

## 2021-08-28 ENCOUNTER — Ambulatory Visit (INDEPENDENT_AMBULATORY_CARE_PROVIDER_SITE_OTHER): Payer: Medicaid Other | Admitting: Obstetrics and Gynecology

## 2021-08-28 ENCOUNTER — Other Ambulatory Visit (HOSPITAL_COMMUNITY)
Admission: RE | Admit: 2021-08-28 | Discharge: 2021-08-28 | Disposition: A | Discharge status: Home / Self Care | Payer: Medicaid Other | Source: Ambulatory Visit | Attending: Obstetrics and Gynecology | Admitting: Obstetrics and Gynecology

## 2021-08-28 ENCOUNTER — Encounter: Payer: Self-pay | Admitting: Obstetrics and Gynecology

## 2021-08-28 VITALS — BP 100/70 | Ht 64.0 in | Wt 174.0 lb

## 2021-08-28 DIAGNOSIS — Z113 Encounter for screening for infections with a predominantly sexual mode of transmission: Secondary | ICD-10-CM | POA: Insufficient documentation

## 2021-08-28 DIAGNOSIS — L708 Other acne: Secondary | ICD-10-CM

## 2021-08-28 DIAGNOSIS — Z130 Encounter for screening for diseases of the blood and blood-forming organs and certain disorders involving the immune mechanism: Secondary | ICD-10-CM

## 2021-08-28 DIAGNOSIS — Z30432 Encounter for removal of intrauterine contraceptive device: Secondary | ICD-10-CM

## 2021-08-28 DIAGNOSIS — R110 Nausea: Secondary | ICD-10-CM

## 2021-08-28 LAB — POCT HEMOGLOBIN: Hemoglobin: 13.4 g/dL (ref 11–14.6)

## 2021-08-28 NOTE — Progress Notes (Signed)
Chief Complaint  Patient presents with   IUD removal    Feeling nauseous, acne break-out x 2.5 weeks, not interested in [facility]     History of Present Illness:  Linda Choi is a 26 y.o. that had a Mirena IUD placed approximately 1 years ago. Since that time, she denies dyspareunia, pelvic pain, non-menstrual bleeding, vaginal d/c, heavy bleeding. Has had nausea without diarrhea/constipation recently. Also with increased acne. Neg UPTs periodically. She is sex active, plans to use condoms. Conception ok.  Hx of anemia after birth of last baby last yr. Was given Fe supp but not taking them now. Wants to make sure she doesn't need more.  Due for STD testing. Neg pap 5/21  Past Medical History:  Diagnosis Date   Anemia    Cervical incompetence    Ovarian cyst    Type A blood, Rh negative    Past Surgical History:  Procedure Laterality Date   NO PAST SURGERIES     Family History  Problem Relation Age of Onset   Eclampsia Mother    Pancreatic cancer Paternal 21        not sure of age   Breast cancer Neg Hx    Ovarian cancer Neg Hx    Review of Systems  Constitutional:  Negative for fever.  Gastrointestinal:  Negative for blood in stool, constipation, diarrhea, nausea and vomiting.  Genitourinary:  Negative for dyspareunia, dysuria, flank pain, frequency, hematuria, urgency, vaginal bleeding, vaginal discharge and vaginal pain.  Musculoskeletal:  Negative for back pain.  Skin:  Negative for rash.    BP 100/70    Ht 5\' 4"  (1.626 m)    Wt 174 lb (78.9 kg)    Breastfeeding No    BMI 29.87 kg/m   Physical Exam Vitals reviewed.  Constitutional:      Appearance: She is well-developed.  Pulmonary:     Effort: Pulmonary effort is normal.  Genitourinary:    General: Normal vulva.     Pubic Area: No rash.      Labia:        Right: No rash, tenderness or lesion.        Left: No rash, tenderness or lesion.      Vagina: Normal. No vaginal discharge, erythema or tenderness.      Cervix: Normal.     Uterus: Normal. Not enlarged and not tender.      Adnexa: Right adnexa normal and left adnexa normal.       Right: No mass or tenderness.         Left: No mass or tenderness.       Comments: IUD STRINGS IN CX OS Musculoskeletal:        General: Normal range of motion.     Cervical back: Normal range of motion.  Skin:    General: Skin is warm and dry.  Neurological:     General: No focal deficit present.     Mental Status: She is alert and oriented to person, place, and time.  Psychiatric:        Mood and Affect: Mood normal.        Behavior: Behavior normal.        Thought Content: Thought content normal.        Judgment: Judgment normal.    IUD Removal Strings of IUD identified and grasped.  IUD removed without problem with ring forceps.  Pt tolerated this well.  IUD noted to be intact.  Results for orders  placed or performed in visit on 08/28/21 (from the past 24 hour(s))  POCT hemoglobin     Status: None   Collection Time: 08/28/21  2:25 PM  Result Value Ref Range   Hemoglobin 13.4 11 - 14.6 g/dL     Assessment/Plan:  Screening for STD (sexually transmitted disease) - Plan: Cervicovaginal ancillary only  Screening for deficiency anemia - Plan: POCT hemoglobin; WNL HgB. Reassurance. F/u prn.   Encounter for IUD removal--tolerated well. Will do condoms, conception ok. F/u prn.    Matricia Begnaud B. Zema Lizardo, PA-C 08/28/2021 2:25 PM

## 2021-08-30 LAB — CERVICOVAGINAL ANCILLARY ONLY
Chlamydia: NEGATIVE
Comment: NEGATIVE
Comment: NORMAL
Neisseria Gonorrhea: NEGATIVE

## 2022-02-12 ENCOUNTER — Ambulatory Visit: Payer: Medicaid Other | Admitting: Obstetrics and Gynecology

## 2022-02-22 ENCOUNTER — Encounter: Payer: Self-pay | Admitting: Licensed Practical Nurse

## 2022-02-22 ENCOUNTER — Ambulatory Visit (INDEPENDENT_AMBULATORY_CARE_PROVIDER_SITE_OTHER): Payer: Medicaid Other | Admitting: Licensed Practical Nurse

## 2022-02-22 VITALS — BP 110/70 | Ht 64.0 in | Wt 175.0 lb

## 2022-02-22 DIAGNOSIS — Z113 Encounter for screening for infections with a predominantly sexual mode of transmission: Secondary | ICD-10-CM

## 2022-02-22 DIAGNOSIS — N912 Amenorrhea, unspecified: Secondary | ICD-10-CM

## 2022-02-22 LAB — POCT URINE PREGNANCY: Preg Test, Ur: NEGATIVE

## 2022-02-22 NOTE — Progress Notes (Signed)
  Gynecology STD Evaluation   Chief Complaint:  Chief Complaint  Patient presents with  . Menstrual Problem    History of Present Illness: Patient is a 26 y.o. [id number] presents for STD testing. Pt received a mychart message stating she was overdue for her HPV vaccines and Hep C screening.   Linda Choi did have gc/ct screening about 2 weeks ago (which was negative) but has not had bloodwork down for STD testing.  She is sexually active with 1 female partner, this is a relatively new partner.       PMHx: She  has a past medical history of Anemia, Cervical incompetence, Ovarian cyst, and Type A blood, Rh negative. Also,  has a past surgical history that includes No past surgeries., family history includes Eclampsia in her mother; Pancreatic cancer in her paternal aunt.,  reports that she has never smoked. She has never used smokeless tobacco. She reports that she does not drink alcohol and does not use drugs.  She currently has no medications in their medication list. Also, has No Known Allergies.  ROS  Objective: BP 110/70   Ht 5\' 4"  (1.626 m)   Wt 175 lb (79.4 kg)   BMI 30.04 kg/m  OBGyn Exam  Female chaperone present for pelvic and breast  portions of the physical exam  Assessment: 26 y.o. 25 No problem-specific Assessment & Plan notes found for this encounter.   Plan: Problem List Items Addressed This Visit   None Visit Diagnoses     Screening examination for venereal disease    -  Primary   Relevant Orders   HEP, RPR, HIV Panel   Hepatitis C antibody        1) Contraception - Education given regarding options for contraception, including {contraceptive options (MU measure 33):20677}.   2) STI screening was offered {DONE:12093}  3) {Gyn counseling:5270}  @PRHSTAMP @

## 2022-02-23 LAB — HEP, RPR, HIV PANEL
HIV Screen 4th Generation wRfx: NONREACTIVE
Hepatitis B Surface Ag: NEGATIVE
RPR Ser Ql: NONREACTIVE

## 2022-02-23 LAB — HEPATITIS C ANTIBODY: Hep C Virus Ab: NONREACTIVE

## 2022-05-27 ENCOUNTER — Encounter: Payer: Self-pay | Admitting: Obstetrics and Gynecology

## 2022-05-28 ENCOUNTER — Encounter: Payer: Self-pay | Admitting: Advanced Practice Midwife

## 2022-05-28 NOTE — Progress Notes (Signed)
Letter sent to patient regarding preference for lifting restriction at new job in [place].
